# Patient Record
Sex: Female | Born: 1995 | Race: Asian | Hispanic: No | Marital: Married | Smoking: Never smoker
Health system: Southern US, Community
[De-identification: ages and names within clinical notes are randomized; demographics above are authoritative.]

## PROBLEM LIST (undated history)

## (undated) ENCOUNTER — Inpatient Hospital Stay (HOSPITAL_COMMUNITY): Payer: Self-pay

## (undated) DIAGNOSIS — D649 Anemia, unspecified: Secondary | ICD-10-CM

## (undated) HISTORY — PX: NO PAST SURGERIES: SHX2092

## (undated) HISTORY — DX: Anemia, unspecified: D64.9

---
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
## 2015-02-27 LAB — OB RESULTS CONSOLE GC/CHLAMYDIA
CHLAMYDIA, DNA PROBE: NEGATIVE
GC PROBE AMP, GENITAL: NEGATIVE

## 2015-02-27 LAB — OB RESULTS CONSOLE ABO/RH: RH Type: POSITIVE

## 2015-02-27 LAB — OB RESULTS CONSOLE PLATELET COUNT
Platelets: 198 10*3/uL
Platelets: 198 10*3/uL

## 2015-02-27 LAB — OB RESULTS CONSOLE VARICELLA ZOSTER ANTIBODY, IGG: VARICELLA IGG: IMMUNE

## 2015-02-27 LAB — OB RESULTS CONSOLE HGB/HCT, BLOOD
HCT: 38 %
HCT: 38 %
HEMOGLOBIN: 12.3 g/dL
HEMOGLOBIN: 12.3 g/dL

## 2015-02-27 LAB — SICKLE CELL SCREEN: Sickle Cell Screen: NORMAL

## 2015-02-27 LAB — OB RESULTS CONSOLE RPR: RPR: NONREACTIVE

## 2015-02-28 ENCOUNTER — Encounter: Payer: Self-pay | Admitting: *Deleted

## 2015-02-28 DIAGNOSIS — IMO0002 Reserved for concepts with insufficient information to code with codable children: Secondary | ICD-10-CM

## 2015-02-28 LAB — OB RESULTS CONSOLE HEPATITIS B SURFACE ANTIGEN: HEP B S AG: NEGATIVE

## 2015-02-28 LAB — OB RESULTS CONSOLE ANTIBODY SCREEN: Antibody Screen: NEGATIVE

## 2015-02-28 LAB — OB RESULTS CONSOLE HIV ANTIBODY (ROUTINE TESTING): HIV: NONREACTIVE

## 2015-02-28 LAB — OB RESULTS CONSOLE RUBELLA ANTIBODY, IGM: RUBELLA: IMMUNE

## 2015-03-16 ENCOUNTER — Encounter: Payer: Medicaid Other | Admitting: Obstetrics & Gynecology

## 2015-03-21 ENCOUNTER — Encounter: Payer: Self-pay | Admitting: *Deleted

## 2015-04-06 ENCOUNTER — Encounter: Payer: Self-pay | Admitting: Obstetrics & Gynecology

## 2015-04-06 ENCOUNTER — Ambulatory Visit (INDEPENDENT_AMBULATORY_CARE_PROVIDER_SITE_OTHER): Payer: Medicaid Other | Admitting: Obstetrics & Gynecology

## 2015-04-06 VITALS — BP 126/70 | HR 64 | Temp 97.6°F | Ht 62.5 in | Wt 98.1 lb

## 2015-04-06 DIAGNOSIS — O09892 Supervision of other high risk pregnancies, second trimester: Secondary | ICD-10-CM

## 2015-04-06 DIAGNOSIS — Z23 Encounter for immunization: Secondary | ICD-10-CM | POA: Diagnosis not present

## 2015-04-06 DIAGNOSIS — O09212 Supervision of pregnancy with history of pre-term labor, second trimester: Secondary | ICD-10-CM | POA: Diagnosis not present

## 2015-04-06 DIAGNOSIS — O09292 Supervision of pregnancy with other poor reproductive or obstetric history, second trimester: Secondary | ICD-10-CM

## 2015-04-06 DIAGNOSIS — N898 Other specified noninflammatory disorders of vagina: Secondary | ICD-10-CM

## 2015-04-06 DIAGNOSIS — O0992 Supervision of high risk pregnancy, unspecified, second trimester: Secondary | ICD-10-CM

## 2015-04-06 LAB — POCT URINALYSIS DIP (DEVICE)
Bilirubin Urine: NEGATIVE
GLUCOSE, UA: NEGATIVE mg/dL
Hgb urine dipstick: NEGATIVE
KETONES UR: NEGATIVE mg/dL
LEUKOCYTES UA: NEGATIVE
Nitrite: NEGATIVE
Protein, ur: NEGATIVE mg/dL
SPECIFIC GRAVITY, URINE: 1.015 (ref 1.005–1.030)
UROBILINOGEN UA: 0.2 mg/dL (ref 0.0–1.0)
pH: 7 (ref 5.0–8.0)

## 2015-04-06 NOTE — Progress Notes (Signed)
   Subjective:    Sylvia Bullock is a G2P0100 [redacted]w[redacted]d being seen today for her first obstetrical visit.  Her obstetrical history is significant for PPROM at 21 weeks.  Went home and then delivered 3 days later and baby lived a few minutes. Patient does intend to breast feed. Pregnancy history fully reviewed.  Patient reports no bleeding, no contractions, no cramping and no leaking.  Filed Vitals:   04/06/15 0856 04/06/15 0905  BP:  126/70  Pulse:  64  Temp:  97.6 F (36.4 C)  Height:  (1.575 m)   Weight:  98 lb 1.6 oz (44.498 kg)    HISTORY: OB History  Gravida Para Term Preterm AB SAB TAB Ectopic Multiple Living  # Outcome Date GA Lbr Len/2nd Weight Sex Delivery Anes PTL Lv  2 Current           1 Preterm 03/30/14 [redacted]w[redacted]d   F Vag-Spont None  ND    Obstetric Comments  Fetal Death on 04/07/14   History reviewed. No pertinent past medical history. History reviewed. No pertinent past surgical history. History reviewed. No pertinent family history.   Exam    Uterus:     Pelvic Exam:    Perineum: Normal Perineum   Vulva: normal   Vagina:  normal mucosa, curdlike discharge   pH: N/a   Cervix: no lesions and closed at internal os   Adnexa: normal adnexa   Bony Pelvis: platypelloid  System: Breast:  normal appearance, no masses or tenderness   Skin: normal coloration and turgor, no rashes    Neurologic: oriented, normal mood   Extremities: normal strength, tone, and muscle mass   HEENT sclera clear, anicteric and oropharynx clear, no lesions   Mouth/Teeth mucous membranes moist, pharynx normal without lesions   Neck supple and no masses   Cardiovascular: regular rate and rhythm   Respiratory:  appears well, vitals normal, no respiratory distress, acyanotic, normal RR, chest clear, no wheezing, crepitations, rhonchi, normal symmetric air entry   Abdomen: soft, non-tender; bowel sounds normal; no masses,  no organomegaly   Urinary: urethral meatus normal       Assessment:    Pregnancy: G2P0100 Patient Active Problem List   Diagnosis Date Noted  . Fetal death Mar 07, 2015        Plan:     Initial labs drawn. Prenatal vitamins. Problem list reviewed and updated. Genetic Screening discussed--Need to talk so me more and draw QUAD screen if she desires.  Ultrasound discussed; fetal survey: ordered.  Follow up in 2 weeks.  1>  Probable cervical incompetence--Needs 17P, STAT US for cervical length 2>Confirm genetic screening 3>Finish getting labs from health department 4>cervix closed today (internal os is close, external os is 1 cm) Leverne Amrhein H. 04/06/2015

## 2015-04-06 NOTE — Progress Notes (Signed)
Ultrasound scheduled for 04/11/2015 :00am [name] interpreter Win Khine)

## 2015-04-06 NOTE — Progress Notes (Signed)
Language Resources Interpreter Win Khine New ob packet given

## 2015-04-06 NOTE — Progress Notes (Signed)
Nutrition note: 1st visit consult Pt was underwt prior to pregnancy.  Pt has gained 13.1# @ [redacted]w[redacted]d, which is slightly > expected. Pt reports eating 2-3 meals & 1-2 snacks/d. Pt is not taking a PNV currently due to not liking the most recent ones she got. Pt reports no N&V or heartburn. NKFA. Pt received verbal & written education via an interpreter about general nutrition during pregnancy. Encouraged PNV daily. Discussed importance of BF & discussed size of baby's stomach. Discussed wt gain goals of 28-40# or 1#/wk. Pt agrees to restart taking a PNV. Pt has WIC & plans to BF. F/u as needed Blondell Reveal, MS, RD, LDN, [facility]

## 2015-04-07 ENCOUNTER — Encounter: Payer: Self-pay | Admitting: Obstetrics & Gynecology

## 2015-04-07 DIAGNOSIS — O0990 Supervision of high risk pregnancy, unspecified, unspecified trimester: Secondary | ICD-10-CM | POA: Insufficient documentation

## 2015-04-07 DIAGNOSIS — O0992 Supervision of high risk pregnancy, unspecified, second trimester: Secondary | ICD-10-CM | POA: Insufficient documentation

## 2015-04-07 DIAGNOSIS — Z8751 Personal history of pre-term labor: Secondary | ICD-10-CM | POA: Insufficient documentation

## 2015-04-07 LAB — WET PREP, GENITAL
TRICH WET PREP: NONE SEEN
YEAST WET PREP: NONE SEEN

## 2015-04-09 ENCOUNTER — Other Ambulatory Visit: Payer: Self-pay | Admitting: Obstetrics & Gynecology

## 2015-04-09 MED ORDER — METRONIDAZOLE 500 MG PO TABS: 500.0000 mg | ORAL_TABLET | Freq: Two times a day (BID) | ORAL | Status: DC

## 2015-04-10 ENCOUNTER — Telehealth: Payer: Self-pay | Admitting: *Deleted

## 2015-04-10 NOTE — Telephone Encounter (Signed)
[employer] Interpreter ID # [phone]  Attempted to contact patient to inform of need to come to clinic on 04/11/15 for Quad test/17P paperwork, no answer, left message for patient to come to the clinic.

## 2015-04-11 ENCOUNTER — Ambulatory Visit (HOSPITAL_COMMUNITY): Admission: RE | Admit: 2015-04-11 | Payer: Medicaid Other | Source: Ambulatory Visit

## 2015-04-13 ENCOUNTER — Encounter: Payer: Self-pay | Admitting: Obstetrics & Gynecology

## 2015-04-19 ENCOUNTER — Ambulatory Visit (HOSPITAL_COMMUNITY)
Admission: RE | Admit: 2015-04-19 | Discharge: 2015-04-19 | Disposition: A | Discharge status: Home / Self Care | Payer: Medicaid Other | Source: Ambulatory Visit | Attending: Obstetrics & Gynecology | Admitting: Obstetrics & Gynecology

## 2015-04-19 DIAGNOSIS — O09292 Supervision of pregnancy with other poor reproductive or obstetric history, second trimester: Secondary | ICD-10-CM | POA: Insufficient documentation

## 2015-04-20 ENCOUNTER — Ambulatory Visit (INDEPENDENT_AMBULATORY_CARE_PROVIDER_SITE_OTHER): Payer: Medicaid Other | Admitting: Obstetrics & Gynecology

## 2015-04-20 VITALS — BP 125/69 | HR 82 | Temp 98.5°F | Wt 98.0 lb

## 2015-04-20 DIAGNOSIS — O09892 Supervision of other high risk pregnancies, second trimester: Secondary | ICD-10-CM

## 2015-04-20 DIAGNOSIS — O09292 Supervision of pregnancy with other poor reproductive or obstetric history, second trimester: Secondary | ICD-10-CM | POA: Diagnosis not present

## 2015-04-20 DIAGNOSIS — O09212 Supervision of pregnancy with history of pre-term labor, second trimester: Secondary | ICD-10-CM

## 2015-04-20 DIAGNOSIS — O0992 Supervision of high risk pregnancy, unspecified, second trimester: Secondary | ICD-10-CM

## 2015-04-20 LAB — POCT URINALYSIS DIP (DEVICE)
BILIRUBIN URINE: NEGATIVE
GLUCOSE, UA: NEGATIVE mg/dL
HGB URINE DIPSTICK: NEGATIVE
Ketones, ur: NEGATIVE mg/dL
LEUKOCYTES UA: NEGATIVE
Nitrite: NEGATIVE
Protein, ur: NEGATIVE mg/dL
Specific Gravity, Urine: 1.01 (ref 1.005–1.030)
UROBILINOGEN UA: 0.2 mg/dL (ref 0.0–1.0)
pH: 7 (ref 5.0–8.0)

## 2015-04-20 MED ORDER — METRONIDAZOLE 500 MG PO TABS: 500.0000 mg | ORAL_TABLET | Freq: Two times a day (BID) | ORAL | Status: DC

## 2015-04-20 MED ORDER — SE-NATAL 19 29-1 MG PO TABS: 1.0000 | ORAL_TABLET | Freq: Every day | ORAL | Status: DC

## 2015-04-20 NOTE — Progress Notes (Signed)
Breastfeeding tip of the week reviewed [employer] interpreter [phone] used

## 2015-04-20 NOTE — Progress Notes (Signed)
Subjective:  Sylvia Bullock is a 19 y.o. G2P0100 at 6348w4d being seen today for ongoing prenatal care.  Patient reports no complaints.  Contractions: Not present.  Vag. Bleeding: None. Movement: Present. Denies leaking of fluid.   The following portions of the patient's history were reviewed and updated as appropriate: allergies, current medications, past family history, past medical history, past social history, past surgical history and problem list. Problem list updated.  Objective:   Filed Vitals:   04/20/15 1015  BP: 125/69  Pulse: 82  Temp: 98.5 F (36.9 C)  Weight: 98 lb (44.453 kg)    Fetal Status: Fetal Heart Rate (bpm): 147   Movement: Present     General:  Alert, oriented and cooperative. Patient is in no acute distress.  Skin: Skin is warm and dry. No rash noted.   Cardiovascular: Normal heart rate noted  Respiratory: Normal respiratory effort, no problems with respiration noted  Abdomen: Soft, gravid, appropriate for gestational age. Pain/Pressure: Absent     Pelvic: Vag. Bleeding: None     Cervical exam deferred        Extremities: Normal range of motion.  Edema: None  Mental Status: Normal mood and affect. Normal behavior. Normal judgment and thought content.   Urinalysis:      Assessment and Plan:  Pregnancy: G2P0100 at 1248w4d  1. Supervision of high-risk pregnancy, second trimester H/o previable delivery, nl cervix on US  2. History of preterm delivery, currently pregnant, second trimester cx length nl  3. History of preterm premature rupture of membranes (PROM) in previous pregnancy, currently pregnant, second trimester  - Prenatal Vit-DSS-Fe Fum-FA (SE-NATAL 19) 29-1 MG TABS; Take 1 tablet by mouth daily before breakfast.  Dispense: 30 tablet; Refill: 5  Preterm labor symptoms and general obstetric precautions including but not limited to vaginal bleeding, contractions, leaking of fluid and fetal movement were reviewed in detail with the patient. Please refer to  After Visit Summary for other counseling recommendations.  Return in about 2 weeks (around 05/04/2015). F/u US 2 weeks  Adam PhenixJames G Kayon Dozier, MD

## 2015-04-20 NOTE — Patient Instructions (Signed)

## 2015-04-24 ENCOUNTER — Encounter: Payer: Self-pay | Admitting: *Deleted

## 2015-05-03 ENCOUNTER — Encounter (HOSPITAL_COMMUNITY): Payer: Self-pay

## 2015-05-03 ENCOUNTER — Ambulatory Visit (HOSPITAL_COMMUNITY)
Admission: RE | Admit: 2015-05-03 | Discharge: 2015-05-03 | Disposition: A | Discharge status: Home / Self Care | Payer: Medicaid Other | Source: Ambulatory Visit | Attending: Family Medicine | Admitting: Family Medicine

## 2015-05-03 ENCOUNTER — Other Ambulatory Visit (HOSPITAL_COMMUNITY): Payer: Self-pay | Admitting: Maternal and Fetal Medicine

## 2015-05-03 DIAGNOSIS — Z3A21 21 weeks gestation of pregnancy: Secondary | ICD-10-CM

## 2015-05-03 DIAGNOSIS — O09299 Supervision of pregnancy with other poor reproductive or obstetric history, unspecified trimester: Secondary | ICD-10-CM | POA: Insufficient documentation

## 2015-05-03 DIAGNOSIS — O09292 Supervision of pregnancy with other poor reproductive or obstetric history, second trimester: Secondary | ICD-10-CM

## 2015-05-04 ENCOUNTER — Encounter: Payer: Self-pay | Admitting: Family Medicine

## 2015-05-04 ENCOUNTER — Encounter: Payer: Medicaid Other | Admitting: Family Medicine

## 2015-05-17 ENCOUNTER — Ambulatory Visit (HOSPITAL_COMMUNITY)
Admission: RE | Admit: 2015-05-17 | Discharge: 2015-05-17 | Disposition: A | Discharge status: Home / Self Care | Payer: Medicaid Other | Source: Ambulatory Visit | Attending: Family Medicine | Admitting: Family Medicine

## 2015-05-17 ENCOUNTER — Encounter (HOSPITAL_COMMUNITY): Payer: Self-pay

## 2015-05-17 DIAGNOSIS — Z3A23 23 weeks gestation of pregnancy: Secondary | ICD-10-CM | POA: Insufficient documentation

## 2015-05-17 DIAGNOSIS — Z36 Encounter for antenatal screening of mother: Secondary | ICD-10-CM | POA: Insufficient documentation

## 2015-05-17 DIAGNOSIS — O09299 Supervision of pregnancy with other poor reproductive or obstetric history, unspecified trimester: Secondary | ICD-10-CM | POA: Insufficient documentation

## 2015-05-17 DIAGNOSIS — O09292 Supervision of pregnancy with other poor reproductive or obstetric history, second trimester: Secondary | ICD-10-CM

## 2015-05-18 ENCOUNTER — Ambulatory Visit (INDEPENDENT_AMBULATORY_CARE_PROVIDER_SITE_OTHER): Payer: Medicaid Other | Admitting: Obstetrics & Gynecology

## 2015-05-18 VITALS — BP 129/71 | HR 68 | Temp 98.0°F | Wt 103.3 lb

## 2015-05-18 DIAGNOSIS — O09212 Supervision of pregnancy with history of pre-term labor, second trimester: Secondary | ICD-10-CM | POA: Diagnosis present

## 2015-05-18 DIAGNOSIS — O09892 Supervision of other high risk pregnancies, second trimester: Secondary | ICD-10-CM

## 2015-05-18 LAB — POCT URINALYSIS DIP (DEVICE)
BILIRUBIN URINE: NEGATIVE
GLUCOSE, UA: NEGATIVE mg/dL
Ketones, ur: NEGATIVE mg/dL
NITRITE: NEGATIVE
Protein, ur: NEGATIVE mg/dL
Specific Gravity, Urine: 1.02 (ref 1.005–1.030)
Urobilinogen, UA: 0.2 mg/dL (ref 0.0–1.0)
pH: 7 (ref 5.0–8.0)

## 2015-05-18 MED ORDER — METRONIDAZOLE 500 MG PO TABS: 500.0000 mg | ORAL_TABLET | Freq: Two times a day (BID) | ORAL | Status: DC

## 2015-05-18 MED ORDER — PROGESTERONE MICRONIZED 200 MG PO CAPS: 200.0000 mg | ORAL_CAPSULE | Freq: Every day | ORAL | Status: DC

## 2015-05-18 NOTE — Patient Instructions (Signed)

## 2015-05-18 NOTE — Progress Notes (Signed)
OB f/u US scheduled for December 2 @ 1515

## 2015-05-18 NOTE — Progress Notes (Signed)
Language Line Used: [phone]

## 2015-05-18 NOTE — Progress Notes (Signed)
Cervix short yesterday by US Subjective:  Sylvia Bullock is a 19 y.o. G2P0100 at 2530w4d being seen today for ongoing prenatal care.  Patient reports no complaints.  Contractions: Not present.  Vag. Bleeding: None. Movement: Present. Denies leaking of fluid.   The following portions of the patient's history were reviewed and updated as appropriate: allergies, current medications, past family history, past medical history, past social history, past surgical history and problem list. Problem list updated.  Objective:   Filed Vitals:   05/18/15 1149  BP: 129/71  Pulse: 68  Temp: 98 F (36.7 C)  Weight: 103 lb 4.8 oz (46.857 kg)    Fetal Status:     Movement: Present     General:  Alert, oriented and cooperative. Patient is in no acute distress.  Skin: Skin is warm and dry. No rash noted.   Cardiovascular: Normal heart rate noted  Respiratory: Normal respiratory effort, no problems with respiration noted  Abdomen: Soft, gravid, appropriate for gestational age. Pain/Pressure: Absent     Pelvic: Vag. Bleeding: None     Cervical exam deferred        Extremities: Normal range of motion.  Edema: None  Mental Status: Normal mood and affect. Normal behavior. Normal judgment and thought content.   Urinalysis: Urine Protein: Negative Urine Glucose: Negative  Assessment and Plan:  Pregnancy: G2P0100 at 930w4d  1. History of preterm delivery, currently pregnant, second trimester Short cervix 2.6 cm 11/16 US - POCT urinalysis dip (device) - metroNIDAZOLE (FLAGYL) 500 MG tablet; Take 1 tablet (500 mg total) by mouth 2 (two) times daily with a meal.  Dispense: 14 tablet; Refill: 0 - US OB Transvaginal; Future - progesterone (PROMETRIUM) 200 MG capsule; Place 1 capsule (200 mg total) vaginally daily.  Dispense: 30 capsule; Refill: 6  Preterm labor symptoms and general obstetric precautions including but not limited to vaginal bleeding, contractions, leaking of fluid and fetal movement were reviewed in  detail with the patient. Please refer to After Visit Summary for other counseling recommendations.  Return in about 3 weeks (around 06/08/2015).   Adam PhenixJames G Sharice Harriss, MD

## 2015-06-02 ENCOUNTER — Ambulatory Visit (HOSPITAL_COMMUNITY)
Admission: RE | Admit: 2015-06-02 | Discharge: 2015-06-02 | Disposition: A | Discharge status: Home / Self Care | Payer: Medicaid Other | Source: Ambulatory Visit | Attending: Obstetrics & Gynecology | Admitting: Obstetrics & Gynecology

## 2015-06-02 ENCOUNTER — Other Ambulatory Visit: Payer: Self-pay | Admitting: Obstetrics & Gynecology

## 2015-06-02 ENCOUNTER — Encounter (HOSPITAL_COMMUNITY): Payer: Self-pay

## 2015-06-02 DIAGNOSIS — O09892 Supervision of other high risk pregnancies, second trimester: Secondary | ICD-10-CM

## 2015-06-02 DIAGNOSIS — Z3A25 25 weeks gestation of pregnancy: Secondary | ICD-10-CM | POA: Diagnosis not present

## 2015-06-02 DIAGNOSIS — O09212 Supervision of pregnancy with history of pre-term labor, second trimester: Principal | ICD-10-CM

## 2015-06-02 DIAGNOSIS — O09292 Supervision of pregnancy with other poor reproductive or obstetric history, second trimester: Secondary | ICD-10-CM | POA: Diagnosis not present

## 2015-06-02 DIAGNOSIS — Z3A26 26 weeks gestation of pregnancy: Secondary | ICD-10-CM

## 2015-06-08 ENCOUNTER — Ambulatory Visit (INDEPENDENT_AMBULATORY_CARE_PROVIDER_SITE_OTHER): Payer: Medicaid Other | Admitting: Family

## 2015-06-08 VITALS — BP 117/69 | HR 72 | Temp 98.3°F | Wt 103.4 lb

## 2015-06-08 DIAGNOSIS — O09212 Supervision of pregnancy with history of pre-term labor, second trimester: Secondary | ICD-10-CM | POA: Diagnosis not present

## 2015-06-08 DIAGNOSIS — O0992 Supervision of high risk pregnancy, unspecified, second trimester: Secondary | ICD-10-CM

## 2015-06-08 DIAGNOSIS — O09892 Supervision of other high risk pregnancies, second trimester: Secondary | ICD-10-CM

## 2015-06-08 LAB — POCT URINALYSIS DIP (DEVICE)
Bilirubin Urine: NEGATIVE
Glucose, UA: NEGATIVE mg/dL
HGB URINE DIPSTICK: NEGATIVE
Ketones, ur: NEGATIVE mg/dL
Leukocytes, UA: NEGATIVE
NITRITE: NEGATIVE
PH: 7 (ref 5.0–8.0)
PROTEIN: NEGATIVE mg/dL
Specific Gravity, Urine: 1.01 (ref 1.005–1.030)
Urobilinogen, UA: 0.2 mg/dL (ref 0.0–1.0)

## 2015-06-08 NOTE — Progress Notes (Signed)
Subjective:  Sylvia Bullock is a 19 y.o. G2P0100 at 7167w3d being seen today for ongoing prenatal care.  She is currently monitored for the following issues for this high-risk pregnancy and has hx of fetal death; History of preterm delivery, currently pregnant; and Supervision of high-risk pregnancy on her problem list.  Patient reports no complaints.  Contractions: Not present. Vag. Bleeding: None.  Movement: Present. Denies leaking of fluid.   The following portions of the patient's history were reviewed and updated as appropriate: allergies, current medications, past family history, past medical history, past social history, past surgical history and problem list. Problem list updated.  Objective:   Filed Vitals:   06/08/15 1007  BP: 117/69  Pulse: 72  Temp: 98.3 F (36.8 C)  Weight: 103 lb 6.4 oz (46.902 kg)    Fetal Status: Fetal Heart Rate (bpm): 157 Fundal Height: 26 cm Movement: Present     General:  Alert, oriented and cooperative. Patient is in no acute distress.  Skin: Skin is warm and dry. No rash noted.   Cardiovascular: Normal heart rate noted  Respiratory: Normal respiratory effort, no problems with respiration noted  Abdomen: Soft, gravid, appropriate for gestational age. Pain/Pressure: Present     Pelvic: Vag. Bleeding: None     Cervical exam deferred        Extremities: Normal range of motion.  Edema: None  Mental Status: Normal mood and affect. Normal behavior. Normal judgment and thought content.   Urinalysis: Urine Protein: Negative Urine Glucose: Negative  Assessment and Plan:  Pregnancy: G2P0100 at 5267w3d  1. Supervision of high-risk pregnancy, second trimester - Obtain lab results from Health Dept; ultrasound scheduled on 06/20/15  2. History of preterm delivery, currently pregnant, second trimester - Cervical length 06/20/15 - Prometrium  Preterm labor symptoms and general obstetric precautions including but not limited to vaginal bleeding, contractions,  leaking of fluid and fetal movement were reviewed in detail with the patient. Please refer to After Visit Summary for other counseling recommendations.  Return in about 2 weeks (around 06/22/2015).   Eino FarberWalidah Kennith GainN Karim, CNM

## 2015-06-09 ENCOUNTER — Encounter: Payer: Self-pay | Admitting: *Deleted

## 2015-06-09 DIAGNOSIS — O0990 Supervision of high risk pregnancy, unspecified, unspecified trimester: Secondary | ICD-10-CM

## 2015-06-09 DIAGNOSIS — O09899 Supervision of other high risk pregnancies, unspecified trimester: Secondary | ICD-10-CM

## 2015-06-09 DIAGNOSIS — O09219 Supervision of pregnancy with history of pre-term labor, unspecified trimester: Principal | ICD-10-CM

## 2015-06-20 ENCOUNTER — Ambulatory Visit (HOSPITAL_COMMUNITY)
Admission: RE | Admit: 2015-06-20 | Discharge: 2015-06-20 | Disposition: A | Discharge status: Home / Self Care | Payer: Medicaid Other | Source: Ambulatory Visit | Attending: Obstetrics & Gynecology | Admitting: Obstetrics & Gynecology

## 2015-06-20 ENCOUNTER — Other Ambulatory Visit (HOSPITAL_COMMUNITY): Payer: Self-pay | Admitting: Obstetrics and Gynecology

## 2015-06-20 ENCOUNTER — Encounter (HOSPITAL_COMMUNITY): Payer: Self-pay

## 2015-06-20 DIAGNOSIS — Z3A28 28 weeks gestation of pregnancy: Secondary | ICD-10-CM | POA: Diagnosis not present

## 2015-06-20 DIAGNOSIS — O09212 Supervision of pregnancy with history of pre-term labor, second trimester: Principal | ICD-10-CM

## 2015-06-20 DIAGNOSIS — O09292 Supervision of pregnancy with other poor reproductive or obstetric history, second trimester: Secondary | ICD-10-CM | POA: Insufficient documentation

## 2015-06-20 DIAGNOSIS — O09892 Supervision of other high risk pregnancies, second trimester: Secondary | ICD-10-CM

## 2015-06-22 ENCOUNTER — Ambulatory Visit (INDEPENDENT_AMBULATORY_CARE_PROVIDER_SITE_OTHER): Payer: Medicaid Other | Admitting: Family

## 2015-06-22 VITALS — BP 136/69 | HR 73 | Temp 98.0°F | Wt 108.3 lb

## 2015-06-22 DIAGNOSIS — O0990 Supervision of high risk pregnancy, unspecified, unspecified trimester: Secondary | ICD-10-CM

## 2015-06-22 DIAGNOSIS — O09892 Supervision of other high risk pregnancies, second trimester: Secondary | ICD-10-CM

## 2015-06-22 DIAGNOSIS — O09212 Supervision of pregnancy with history of pre-term labor, second trimester: Secondary | ICD-10-CM | POA: Diagnosis not present

## 2015-06-22 LAB — CBC
HCT: 35.7 % — ABNORMAL LOW (ref 36.0–46.0)
Hemoglobin: 12 g/dL (ref 12.0–15.0)
MCH: 29.4 pg (ref 26.0–34.0)
MCHC: 33.6 g/dL (ref 30.0–36.0)
MCV: 87.5 fL (ref 78.0–100.0)
MPV: 10.3 fL (ref 8.6–12.4)
PLATELETS: 288 10*3/uL (ref 150–400)
RBC: 4.08 MIL/uL (ref 3.87–5.11)
RDW: 13.8 % (ref 11.5–15.5)
WBC: 8 10*3/uL (ref 4.0–10.5)

## 2015-06-22 LAB — POCT URINALYSIS DIP (DEVICE)
BILIRUBIN URINE: NEGATIVE
GLUCOSE, UA: NEGATIVE mg/dL
Hgb urine dipstick: NEGATIVE
Ketones, ur: NEGATIVE mg/dL
Leukocytes, UA: NEGATIVE
NITRITE: NEGATIVE
PH: 7 (ref 5.0–8.0)
Protein, ur: NEGATIVE mg/dL
Specific Gravity, Urine: 1.015 (ref 1.005–1.030)
Urobilinogen, UA: 1 mg/dL (ref 0.0–1.0)

## 2015-06-22 NOTE — Progress Notes (Signed)
Subjective:  Sylvia Bullock is a 19 y.o. G2P0100 at 7671w3d being seen today for ongoing prenatal care.  She is currently monitored for the following issues for this high-risk pregnancy and has history of fetal death; history of preterm delivery, currently pregnant; and Supervision of high-risk pregnancy on her problem list.  Patient reports no complaints.  Contractions: Not present. Vag. Bleeding: None.  Movement: Present. Denies leaking of fluid.   The following portions of the patient's history were reviewed and updated as appropriate: allergies, current medications, past family history, past medical history, past social history, past surgical history and problem list. Problem list updated.  Objective:   Filed Vitals:   06/22/15 0949  BP: 136/69  Pulse: 73  Temp: 98 F (36.7 C)  Weight: 49.125 kg (108 lb 4.8 oz)    Fetal Status: Fetal Heart Rate (bpm): 154 Fundal Height: 27 cm Movement: Present     General:  Alert, oriented and cooperative. Patient is in no acute distress.  Skin: Skin is warm and dry. No rash noted.   Cardiovascular: Normal heart rate noted  Respiratory: Normal respiratory effort, no problems with respiration noted  Abdomen: Soft, gravid, appropriate for gestational age. Pain/Pressure: Absent     Pelvic: Vag. Bleeding: None     Cervical exam deferred        Extremities: Normal range of motion.  Edema: None  Mental Status: Normal mood and affect. Normal behavior. Normal judgment and thought content.   Urinalysis: Urine Protein: Negative Urine Glucose: Negative  Assessment and Plan:  Pregnancy: G2P0100 at 4171w3d  1. History of preterm delivery, currently pregnant, second trimester - Reviewed last ultrasound cervical length - 2.7 cm  2. Supervision of high-risk pregnancy, unspecified trimester - Glucose Tolerance, 1 HR (50g) w/o Fasting - CBC - RPR - HIV antibody (with reflex)  Preterm labor symptoms and general obstetric precautions including but not limited to  vaginal bleeding, contractions, leaking of fluid and fetal movement were reviewed in detail with the patient. Please refer to After Visit Summary for other counseling recommendations.  Return in about 2 weeks (around 07/06/2015).   Eino FarberWalidah Kennith GainN Karim, CNM

## 2015-06-23 LAB — GLUCOSE TOLERANCE, 1 HOUR (50G) W/O FASTING: GLUCOSE 1 HOUR GTT: 64 mg/dL — AB (ref 70–140)

## 2015-06-23 LAB — HIV ANTIBODY (ROUTINE TESTING W REFLEX): HIV: NONREACTIVE

## 2015-06-26 LAB — RPR

## 2015-07-02 NOTE — L&D Delivery Note (Signed)
Patient is 20 y.o. G2P0100 [redacted]w[redacted]d admitted for PPROM. Receivec BMZ x 1. Labor progressed rapidly and patient was complete.    Delivery Note At 8:43 PM a viable female was delivered via Vaginal, Spontaneous Delivery (Presentation: Left Occiput Anterior).  APGAR: 9, 10; weight 2430g  .   Placenta status: Intact, Spontaneous. Manual extraction of trailing membranes x 1 then again due to continued vaginal bleeding. Patient was given Ancef 2g and Methergine.  Cord: 3 vessels with the following complications: retained placental membrane removed manually. Ancef 2 g given.  Cord pH: not obtained.   Anesthesia: None  Episiotomy: None Lacerations: 1st degree;Perineal-repaired; left labial laceration- repaired; right periurethral laceration- did not require repair Suture Repair: 3.0 and 4.0 vicryl Est. Blood Loss (mL):    Mom to postpartum.  Baby to Couplet care / Skin to Skin.  Palma Holter 08/15/2015, 9:24 PM    Upon arrival patient was complete and pushing. She pushed with good maternal effort to deliver a healthy baby boy. Baby delivered without difficulty, was noted to have good tone and place on maternal abdomen for oral suctioning, drying and stimulation. Delayed cord clamping performed. Placenta delivered intact with 3V cord. Vaginal canal and perineum was inspected and 1st degree perineal laceration (repaired), left periurethral laceration (repaired), and right periurethral laceration (not requiring repair); hemostatic. Pitocin was started and uterus massaged until bleeding slowed. Counts of sharps, instruments, and lap pads were all correct.   Palma Holter, MD PGY 1 Family Medicine  OB FELLOW DELIVERY ATTESTATION  I was gloved and present for the delivery in its entirety, and I agree with the above resident's note.    Silvano Bilis, MD 11:43 PM

## 2015-07-06 ENCOUNTER — Ambulatory Visit (INDEPENDENT_AMBULATORY_CARE_PROVIDER_SITE_OTHER): Payer: Medicaid Other | Admitting: Family

## 2015-07-06 VITALS — BP 114/66 | HR 86 | Temp 98.0°F | Wt 109.4 lb

## 2015-07-06 DIAGNOSIS — O09893 Supervision of other high risk pregnancies, third trimester: Secondary | ICD-10-CM

## 2015-07-06 DIAGNOSIS — O0993 Supervision of high risk pregnancy, unspecified, third trimester: Secondary | ICD-10-CM

## 2015-07-06 DIAGNOSIS — O09213 Supervision of pregnancy with history of pre-term labor, third trimester: Secondary | ICD-10-CM | POA: Diagnosis not present

## 2015-07-06 LAB — POCT URINALYSIS DIP (DEVICE)
Bilirubin Urine: NEGATIVE
GLUCOSE, UA: NEGATIVE mg/dL
Hgb urine dipstick: NEGATIVE
Ketones, ur: NEGATIVE mg/dL
Nitrite: NEGATIVE
Protein, ur: NEGATIVE mg/dL
SPECIFIC GRAVITY, URINE: 1.02 (ref 1.005–1.030)
UROBILINOGEN UA: 1 mg/dL (ref 0.0–1.0)
pH: 7 (ref 5.0–8.0)

## 2015-07-06 MED ORDER — CYCLOBENZAPRINE HCL 10 MG PO TABS: 10.0000 mg | ORAL_TABLET | Freq: Every day | ORAL | Status: DC

## 2015-07-06 NOTE — Progress Notes (Signed)
Subjective:  Sylvia Bullock is a 20 y.o. G2P0100 at 7668w3d being seen today for ongoing prenatal care.  She is currently monitored for the following issues for this high-risk pregnancy and has hx of fetal death; history of preterm delivery, currently pregnant; and supervision of high-risk pregnancy on her problem list.  Patient reports lower back pain with walking and certain positions.  Contractions: Not present. Vag. Bleeding: None.  Movement: Present. Denies leaking of fluid.   The following portions of the patient's history were reviewed and updated as appropriate: allergies, current medications, past family history, past medical history, past social history, past surgical history and problem list. Problem list updated.  Objective:   Filed Vitals:   07/06/15 0926  BP: 114/66  Pulse: 86  Temp: 98 F (36.7 C)  Weight: 109 lb 6.4 oz (49.624 kg)    Fetal Status: Fetal Heart Rate (bpm): 148 Fundal Height: 31 cm Movement: Present     General:  Alert, oriented and cooperative. Patient is in no acute distress.  Skin: Skin is warm and dry. No rash noted.   Cardiovascular: Normal heart rate noted  Respiratory: Normal respiratory effort, no problems with respiration noted  Abdomen: Soft, gravid, appropriate for gestational age. Pain/Pressure: Present     Pelvic: Vag. Bleeding: None     Cervical exam deferred        Extremities: Normal range of motion.  Edema: None  Mental Status: Normal mood and affect. Normal behavior. Normal judgment and thought content.   Urinalysis:     Urine results not available at discharge.     Assessment and Plan:  Pregnancy: G2P0100 at 2468w3d  1. History of preterm delivery, currently pregnant, third trimester - Explained importance of prometrium and benefits > pt states will use consistently  2. Supervision of high-risk pregnancy, third trimester   Preterm labor symptoms and general obstetric precautions including but not limited to vaginal bleeding,  contractions, leaking of fluid and fetal movement were reviewed in detail with the patient. Please refer to After Visit Summary for other counseling recommendations.  Return in about 2 weeks (around 07/20/2015).   Eino FarberWalidah Kennith GainN Karim, CNM

## 2015-07-06 NOTE — Progress Notes (Signed)
Pt not using prometrium as prescribed. Just does it sometimes because she doesn't like to insert it.

## 2015-07-20 ENCOUNTER — Ambulatory Visit (INDEPENDENT_AMBULATORY_CARE_PROVIDER_SITE_OTHER): Payer: Medicaid Other | Admitting: Obstetrics & Gynecology

## 2015-07-20 VITALS — BP 115/70 | HR 66 | Temp 97.9°F | Wt 111.0 lb

## 2015-07-20 DIAGNOSIS — Z789 Other specified health status: Secondary | ICD-10-CM | POA: Insufficient documentation

## 2015-07-20 DIAGNOSIS — O09213 Supervision of pregnancy with history of pre-term labor, third trimester: Secondary | ICD-10-CM | POA: Diagnosis present

## 2015-07-20 DIAGNOSIS — Z603 Acculturation difficulty: Secondary | ICD-10-CM | POA: Insufficient documentation

## 2015-07-20 DIAGNOSIS — O0993 Supervision of high risk pregnancy, unspecified, third trimester: Secondary | ICD-10-CM

## 2015-07-20 DIAGNOSIS — O09893 Supervision of other high risk pregnancies, third trimester: Secondary | ICD-10-CM

## 2015-07-20 LAB — POCT URINALYSIS DIP (DEVICE)
BILIRUBIN URINE: NEGATIVE
Glucose, UA: NEGATIVE mg/dL
Hgb urine dipstick: NEGATIVE
KETONES UR: NEGATIVE mg/dL
Nitrite: NEGATIVE
PH: 7.5 (ref 5.0–8.0)
Protein, ur: NEGATIVE mg/dL
SPECIFIC GRAVITY, URINE: 1.02 (ref 1.005–1.030)
Urobilinogen, UA: 1 mg/dL (ref 0.0–1.0)

## 2015-07-20 NOTE — Progress Notes (Signed)
Used interpreter [employer] C/o feeling bad after meals- acid reflux. C/o problems going to bathroom- difficult due to language differences if she means constipation or gas.

## 2015-07-20 NOTE — Patient Instructions (Addendum)
Tums or Pepcid for acid reflux.    Return to clinic for any obstetric concerns or go to [facility] for evaluation

## 2015-07-20 NOTE — Progress Notes (Signed)
Subjective:  Sylvia Bullock is a 20 y.o. G2P0100 at [redacted]w[redacted]d being seen today for ongoing prenatal care.  She is currently monitored for the following issues for this high-risk pregnancy and has Fetal death; History of preterm delivery, currently pregnant; Supervision of high-risk pregnancy; and Language barrier, speaks [name] only on her problem list.  [name] interpreter present.  Patient reports acid reflux and constipation.  Contractions: Not present. Vag. Bleeding: None.  Movement: Present. Denies leaking of fluid.   The following portions of the patient's history were reviewed and updated as appropriate: allergies, current medications, past family history, past medical history, past social history, past surgical history and problem list. Problem list updated.  Objective:   Filed Vitals:   07/20/15 0956  BP: 115/70  Pulse: 66  Temp: 97.9 F (36.6 C)  Weight: 111 lb (50.349 kg)    Fetal Status: Fetal Heart Rate (bpm): 140 Fundal Height: 32 cm Movement: Present     General:  Alert, oriented and cooperative. Patient is in no acute distress.  Skin: Skin is warm and dry. No rash noted.   Cardiovascular: Normal heart rate noted  Respiratory: Normal respiratory effort, no problems with respiration noted  Abdomen: Soft, gravid, appropriate for gestational age. Pain/Pressure: Present     Pelvic: Vag. Bleeding: None     Cervical exam deferred        Extremities: Normal range of motion.  Edema: None (Simultaneous filing. User may not have seen previous data.)  Mental Status: Normal mood and affect. Normal behavior. Normal judgment and thought content.   Urinalysis: Urine Protein: Negative Urine Glucose: Negative  Assessment and Plan:  Pregnancy: G2P0100 at [redacted]w[redacted]d  1. History of preterm delivery, currently pregnant, third trimester Continue Prometrium vaginal capsules daily.  2. Supervision of high-risk pregnancy, third trimester Recommended OTC remedies for acid reflux and bowel issues Preterm  labor symptoms and general obstetric precautions including but not limited to vaginal bleeding, contractions, leaking of fluid and fetal movement were reviewed in detail with the patient. Please refer to After Visit Summary for other counseling recommendations.  Return in about 2 weeks (around 08/03/2015) for OB Visit.   Tereso Newcomer, MD

## 2015-08-03 ENCOUNTER — Telehealth: Payer: Self-pay | Admitting: Family

## 2015-08-03 ENCOUNTER — Encounter: Payer: Self-pay | Admitting: Family

## 2015-08-03 ENCOUNTER — Encounter: Payer: Medicaid Other | Admitting: Family

## 2015-08-03 NOTE — Telephone Encounter (Signed)
Called patient with Interpreter to inform her of missed appointment. Patient was not available, voicemail not left. Sending certified [facility] letter.

## 2015-08-07 ENCOUNTER — Encounter: Payer: Medicaid Other | Admitting: Family Medicine

## 2015-08-10 ENCOUNTER — Ambulatory Visit (INDEPENDENT_AMBULATORY_CARE_PROVIDER_SITE_OTHER): Payer: Medicaid Other | Admitting: Obstetrics & Gynecology

## 2015-08-10 VITALS — BP 125/82 | HR 60 | Temp 97.7°F | Wt 113.0 lb

## 2015-08-10 DIAGNOSIS — Z23 Encounter for immunization: Secondary | ICD-10-CM

## 2015-08-10 DIAGNOSIS — O0993 Supervision of high risk pregnancy, unspecified, third trimester: Secondary | ICD-10-CM

## 2015-08-10 LAB — POCT URINALYSIS DIP (DEVICE)
Bilirubin Urine: NEGATIVE
Glucose, UA: NEGATIVE mg/dL
HGB URINE DIPSTICK: NEGATIVE
Ketones, ur: NEGATIVE mg/dL
NITRITE: NEGATIVE
PH: 7 (ref 5.0–8.0)
PROTEIN: NEGATIVE mg/dL
Specific Gravity, Urine: 1.015 (ref 1.005–1.030)
Urobilinogen, UA: 0.2 mg/dL (ref 0.0–1.0)

## 2015-08-10 MED ORDER — TETANUS-DIPHTH-ACELL PERTUSSIS 5-2.5-18.5 LF-MCG/0.5 IM SUSP
0.5000 mL | Freq: Once | INTRAMUSCULAR | Status: AC
  Administered 2015-08-10: 0.5 mL via INTRAMUSCULAR

## 2015-08-10 NOTE — Progress Notes (Signed)
Win Khine used for interpreter  Discussed breastfeeding ed with patient

## 2015-08-10 NOTE — Progress Notes (Signed)
U/S scheduled for 08/11/2015 @ 9:00AM with help of [name] interpreter

## 2015-08-10 NOTE — Patient Instructions (Signed)

## 2015-08-10 NOTE — Progress Notes (Signed)
Subjective:  Sylvia Bullock is a 20 y.o. G2P0100 at [redacted]w[redacted]d being seen today for ongoing prenatal care.  She is currently monitored for the following issues for this high-risk pregnancy and has Fetal death; History of preterm delivery, currently pregnant; Supervision of high-risk pregnancy; and Language barrier, speaks [name] only on her problem list.  Patient reports no complaints.  Contractions: Not present. Vag. Bleeding: None.  Movement: Present. Denies leaking of fluid.   The following portions of the patient's history were reviewed and updated as appropriate: allergies, current medications, past family history, past medical history, past social history, past surgical history and problem list. Problem list updated.  Objective:   Filed Vitals:   08/10/15 1011  BP: 125/82  Pulse: 60  Temp: 97.7 F (36.5 C)  Weight: 113 lb (51.256 kg)    Fetal Status: Fetal Heart Rate (bpm): 137   Movement: Present     General:  Alert, oriented and cooperative. Patient is in no acute distress.  Skin: Skin is warm and dry. No rash noted.   Cardiovascular: Normal heart rate noted  Respiratory: Normal respiratory effort, no problems with respiration noted  Abdomen: Soft, gravid, appropriate for gestational age. Pain/Pressure: Absent     Pelvic: Vag. Bleeding: None     Cervical exam deferred        Extremities: Normal range of motion.  Edema: None  Mental Status: Normal mood and affect. Normal behavior. Normal judgment and thought content.   Urinalysis:      Assessment and Plan:  Pregnancy: G2P0100 at [redacted]w[redacted]d  1. Supervision of high-risk pregnancy, third trimester S<D - US OB Follow Up; Future Needs f/u US for growth Term labor symptoms and general obstetric precautions including but not limited to vaginal bleeding, contractions, leaking of fluid and fetal movement were reviewed in detail with the patient. Please refer to After Visit Summary for other counseling recommendations.  Return in about 1 week  (around 08/17/2015).   Adam Phenix, MD

## 2015-08-11 ENCOUNTER — Other Ambulatory Visit: Payer: Self-pay | Admitting: Obstetrics & Gynecology

## 2015-08-11 ENCOUNTER — Ambulatory Visit (HOSPITAL_COMMUNITY)
Admission: RE | Admit: 2015-08-11 | Discharge: 2015-08-11 | Disposition: A | Discharge status: Home / Self Care | Payer: Medicaid Other | Source: Ambulatory Visit | Attending: Obstetrics & Gynecology | Admitting: Obstetrics & Gynecology

## 2015-08-11 ENCOUNTER — Encounter (HOSPITAL_COMMUNITY): Payer: Self-pay

## 2015-08-11 DIAGNOSIS — O0993 Supervision of high risk pregnancy, unspecified, third trimester: Secondary | ICD-10-CM

## 2015-08-11 DIAGNOSIS — O09293 Supervision of pregnancy with other poor reproductive or obstetric history, third trimester: Secondary | ICD-10-CM

## 2015-08-11 DIAGNOSIS — Z3A35 35 weeks gestation of pregnancy: Secondary | ICD-10-CM | POA: Insufficient documentation

## 2015-08-11 DIAGNOSIS — O26843 Uterine size-date discrepancy, third trimester: Secondary | ICD-10-CM

## 2015-08-15 ENCOUNTER — Encounter (HOSPITAL_COMMUNITY): Payer: Self-pay | Admitting: Anesthesiology

## 2015-08-15 ENCOUNTER — Encounter (HOSPITAL_COMMUNITY): Payer: Self-pay | Admitting: *Deleted

## 2015-08-15 ENCOUNTER — Inpatient Hospital Stay (HOSPITAL_COMMUNITY)
Admission: AD | Admit: 2015-08-15 | Discharge: 2015-08-17 | DRG: 775 | Disposition: A | Discharge status: Home / Self Care | Payer: Medicaid Other | Source: Ambulatory Visit | Attending: Obstetrics & Gynecology | Admitting: Obstetrics & Gynecology

## 2015-08-15 DIAGNOSIS — Z3A36 36 weeks gestation of pregnancy: Secondary | ICD-10-CM

## 2015-08-15 DIAGNOSIS — O4290 Premature rupture of membranes, unspecified as to length of time between rupture and onset of labor, unspecified weeks of gestation: Secondary | ICD-10-CM | POA: Diagnosis present

## 2015-08-15 DIAGNOSIS — O4292 Full-term premature rupture of membranes, unspecified as to length of time between rupture and onset of labor: Principal | ICD-10-CM | POA: Diagnosis present

## 2015-08-15 DIAGNOSIS — O42913 Preterm premature rupture of membranes, unspecified as to length of time between rupture and onset of labor, third trimester: Secondary | ICD-10-CM | POA: Diagnosis not present

## 2015-08-15 DIAGNOSIS — O0993 Supervision of high risk pregnancy, unspecified, third trimester: Secondary | ICD-10-CM

## 2015-08-15 DIAGNOSIS — O09893 Supervision of other high risk pregnancies, third trimester: Secondary | ICD-10-CM

## 2015-08-15 DIAGNOSIS — O09213 Supervision of pregnancy with history of pre-term labor, third trimester: Secondary | ICD-10-CM

## 2015-08-15 LAB — CBC
HCT: 35.6 % — ABNORMAL LOW (ref 36.0–46.0)
Hemoglobin: 12.2 g/dL (ref 12.0–15.0)
MCH: 29.5 pg (ref 26.0–34.0)
MCHC: 34.3 g/dL (ref 30.0–36.0)
MCV: 86 fL (ref 78.0–100.0)
Platelets: 249 10*3/uL (ref 150–400)
RBC: 4.14 MIL/uL (ref 3.87–5.11)
RDW: 13.4 % (ref 11.5–15.5)
WBC: 7.7 10*3/uL (ref 4.0–10.5)

## 2015-08-15 LAB — TYPE AND SCREEN
ABO/RH(D): B POS
Antibody Screen: NEGATIVE

## 2015-08-15 LAB — POCT FERN TEST: POCT Fern Test: POSITIVE

## 2015-08-15 LAB — ABO/RH: ABO/RH(D): B POS

## 2015-08-15 MED ORDER — LACTATED RINGERS IV SOLN: 500.0000 mL | Freq: Once | INTRAVENOUS | Status: DC

## 2015-08-15 MED ORDER — ACETAMINOPHEN 325 MG PO TABS: 650.0000 mg | ORAL_TABLET | ORAL | Status: DC | PRN

## 2015-08-15 MED ORDER — BETAMETHASONE SOD PHOS & ACET 6 (3-3) MG/ML IJ SUSP
12.0000 mg | Freq: Once | INTRAMUSCULAR | Status: AC
  Administered 2015-08-15: 12 mg via INTRAMUSCULAR
  Filled 2015-08-15: qty 2

## 2015-08-15 MED ORDER — PRENATAL MULTIVITAMIN CH: 1.0000 | ORAL_TABLET | Freq: Every day | ORAL | Status: DC

## 2015-08-15 MED ORDER — ZOLPIDEM TARTRATE 5 MG PO TABS
5.0000 mg | ORAL_TABLET | Freq: Every evening | ORAL | Status: DC | PRN
Start: 2015-08-15 — End: 2015-08-17

## 2015-08-15 MED ORDER — IBUPROFEN 600 MG PO TABS
600.0000 mg | ORAL_TABLET | Freq: Four times a day (QID) | ORAL | Status: DC
  Filled 2015-08-15: qty 1

## 2015-08-15 MED ORDER — OXYTOCIN BOLUS FROM INFUSION
500.0000 mL | INTRAVENOUS | Status: DC
  Administered 2015-08-15: 500 mL via INTRAVENOUS

## 2015-08-15 MED ORDER — OXYCODONE-ACETAMINOPHEN 5-325 MG PO TABS: 2.0000 | ORAL_TABLET | ORAL | Status: DC | PRN

## 2015-08-15 MED ORDER — ONDANSETRON HCL 4 MG PO TABS: 4.0000 mg | ORAL_TABLET | ORAL | Status: DC | PRN

## 2015-08-15 MED ORDER — EPHEDRINE 5 MG/ML INJ
10.0000 mg | INTRAVENOUS | Status: DC | PRN
  Filled 2015-08-15: qty 2

## 2015-08-15 MED ORDER — ONDANSETRON HCL 4 MG/2ML IJ SOLN: 4.0000 mg | Freq: Four times a day (QID) | INTRAMUSCULAR | Status: DC | PRN

## 2015-08-15 MED ORDER — LACTATED RINGERS IV SOLN: 500.0000 mL | INTRAVENOUS | Status: DC | PRN

## 2015-08-15 MED ORDER — PENICILLIN G POTASSIUM 5000000 UNITS IJ SOLR: 2.5000 10*6.[IU] | INTRAVENOUS | Status: DC

## 2015-08-15 MED ORDER — DIBUCAINE 1 % RE OINT: 1.0000 "application " | TOPICAL_OINTMENT | RECTAL | Status: DC | PRN

## 2015-08-15 MED ORDER — OXYTOCIN 10 UNIT/ML IJ SOLN
2.5000 [IU]/h | INTRAMUSCULAR | Status: DC
  Filled 2015-08-15: qty 4

## 2015-08-15 MED ORDER — LIDOCAINE HCL (PF) 1 % IJ SOLN
30.0000 mL | INTRAMUSCULAR | Status: DC | PRN
  Administered 2015-08-15: 30 mL via SUBCUTANEOUS
  Filled 2015-08-15: qty 30

## 2015-08-15 MED ORDER — CITRIC ACID-SODIUM CITRATE 334-500 MG/5ML PO SOLN: 30.0000 mL | ORAL | Status: DC | PRN

## 2015-08-15 MED ORDER — PENICILLIN G POTASSIUM 5000000 UNITS IJ SOLR: 5.0000 10*6.[IU] | Freq: Once | INTRAVENOUS | Status: DC

## 2015-08-15 MED ORDER — METHYLERGONOVINE MALEATE 0.2 MG PO TABS
0.2000 mg | ORAL_TABLET | Freq: Four times a day (QID) | ORAL | Status: DC
  Administered 2015-08-15: 0.2 mg via ORAL
  Filled 2015-08-15: qty 1

## 2015-08-15 MED ORDER — IBUPROFEN 100 MG/5ML PO SUSP
600.0000 mg | Freq: Four times a day (QID) | ORAL | Status: DC
Start: 2015-08-16 — End: 2015-08-17
  Administered 2015-08-16 – 2015-08-17 (×7): 600 mg via ORAL
  Filled 2015-08-15 (×11): qty 30

## 2015-08-15 MED ORDER — SODIUM CHLORIDE 0.9 % IV SOLN
2.0000 g | Freq: Once | INTRAVENOUS | Status: AC
  Administered 2015-08-15: 2 g via INTRAVENOUS
  Filled 2015-08-15: qty 2000

## 2015-08-15 MED ORDER — DIPHENHYDRAMINE HCL 25 MG PO CAPS: 25.0000 mg | ORAL_CAPSULE | Freq: Four times a day (QID) | ORAL | Status: DC | PRN

## 2015-08-15 MED ORDER — FENTANYL CITRATE (PF) 100 MCG/2ML IJ SOLN
100.0000 ug | INTRAMUSCULAR | Status: DC | PRN
  Administered 2015-08-15 (×2): 100 ug via INTRAVENOUS
  Filled 2015-08-15 (×2): qty 2

## 2015-08-15 MED ORDER — OXYCODONE-ACETAMINOPHEN 5-325 MG PO TABS: 1.0000 | ORAL_TABLET | ORAL | Status: DC | PRN

## 2015-08-15 MED ORDER — DIPHENHYDRAMINE HCL 50 MG/ML IJ SOLN: 12.5000 mg | INTRAMUSCULAR | Status: DC | PRN

## 2015-08-15 MED ORDER — LANOLIN HYDROUS EX OINT: TOPICAL_OINTMENT | CUTANEOUS | Status: DC | PRN

## 2015-08-15 MED ORDER — TETANUS-DIPHTH-ACELL PERTUSSIS 5-2.5-18.5 LF-MCG/0.5 IM SUSP: 0.5000 mL | Freq: Once | INTRAMUSCULAR | Status: DC

## 2015-08-15 MED ORDER — FENTANYL 2.5 MCG/ML BUPIVACAINE 1/10 % EPIDURAL INFUSION (WH - ANES)
14.0000 mL/h | INTRAMUSCULAR | Status: DC | PRN
  Filled 2015-08-15: qty 125

## 2015-08-15 MED ORDER — BETAMETHASONE SOD PHOS & ACET 6 (3-3) MG/ML IJ SUSP: 12.0000 mg | Freq: Once | INTRAMUSCULAR | Status: DC

## 2015-08-15 MED ORDER — WITCH HAZEL-GLYCERIN EX PADS: 1.0000 "application " | MEDICATED_PAD | CUTANEOUS | Status: DC | PRN

## 2015-08-15 MED ORDER — PHENYLEPHRINE 40 MCG/ML (10ML) SYRINGE FOR IV PUSH (FOR BLOOD PRESSURE SUPPORT)
80.0000 ug | PREFILLED_SYRINGE | INTRAVENOUS | Status: DC | PRN
  Filled 2015-08-15: qty 20
  Filled 2015-08-15: qty 2

## 2015-08-15 MED ORDER — ONDANSETRON HCL 4 MG/2ML IJ SOLN: 4.0000 mg | INTRAMUSCULAR | Status: DC | PRN

## 2015-08-15 MED ORDER — SENNOSIDES-DOCUSATE SODIUM 8.6-50 MG PO TABS
2.0000 | ORAL_TABLET | ORAL | Status: DC
  Administered 2015-08-16: 2 via ORAL
  Filled 2015-08-15 (×2): qty 2

## 2015-08-15 MED ORDER — PHENYLEPHRINE 40 MCG/ML (10ML) SYRINGE FOR IV PUSH (FOR BLOOD PRESSURE SUPPORT)
80.0000 ug | PREFILLED_SYRINGE | INTRAVENOUS | Status: DC | PRN
  Filled 2015-08-15: qty 2

## 2015-08-15 MED ORDER — BENZOCAINE-MENTHOL 20-0.5 % EX AERO
1.0000 "application " | INHALATION_SPRAY | CUTANEOUS | Status: DC | PRN
  Administered 2015-08-16: 1 via TOPICAL
  Filled 2015-08-15: qty 56

## 2015-08-15 MED ORDER — PENICILLIN G POTASSIUM 5000000 UNITS IJ SOLR
2.5000 10*6.[IU] | INTRAVENOUS | Status: DC
  Filled 2015-08-15 (×3): qty 2.5

## 2015-08-15 MED ORDER — SIMETHICONE 80 MG PO CHEW: 80.0000 mg | CHEWABLE_TABLET | ORAL | Status: DC | PRN

## 2015-08-15 MED ORDER — CEFAZOLIN SODIUM-DEXTROSE 2-3 GM-% IV SOLR
2.0000 g | Freq: Once | INTRAVENOUS | Status: AC
  Administered 2015-08-15: 2 g via INTRAVENOUS
  Filled 2015-08-15: qty 50

## 2015-08-15 MED ORDER — LACTATED RINGERS IV SOLN
INTRAVENOUS | Status: DC
  Administered 2015-08-15: 125 mL/h via INTRAVENOUS

## 2015-08-15 NOTE — H&P (Signed)
LABOR AND DELIVERY ADMISSION HISTORY AND PHYSICAL NOTE  Sylvia Bullock is a 20 y.o. female G2P0100 with IUP at [redacted]w[redacted]d by 19 wk u/s presenting for leakage of fluid. Felt gush at 2 PM today and has had leakage since  She reports positive fetal movement. Denies vaginal bleeding.  Since then has had cramping pains that come and go every 5 minutes or so. No fever.  Prenatal History/Complications:  Past Medical History: History reviewed. No pertinent past medical history.  Past Surgical History: History reviewed. No pertinent past surgical history.  Obstetrical History: OB History    Gravida Para Term Preterm AB TAB SAB Ectopic Multiple Living   0      Obstetric Comments   Delivered viable infant at 21 weeks but died shortly after birth due to prematurity.      Social History: Social History   Social History  . Marital Status: Married    Spouse Name: N/A  . Number of Children: N/A  . Years of Education: N/A   Social History Main Topics  . Smoking status: Never Smoker   . Smokeless tobacco: Never Used  . Alcohol Use: No  . Drug Use: No  . Sexual Activity: Yes   Other Topics Concern  . None   Social History Narrative    Family History: History reviewed. No pertinent family history.  Allergies: No Known Allergies  Prescriptions prior to admission  Medication Sig Dispense Refill Last Dose  . Prenatal Vit-DSS-Fe Fum-FA (SE-NATAL 19) 29-1 MG TABS Take 1 tablet by mouth daily before breakfast. 30 tablet 5 Taking     Review of Systems   All systems reviewed and negative except as stated in HPI  Blood pressure 115/67, pulse 59, temperature 98.2 F (36.8 C), temperature source Oral, resp. rate 20, height 5' 2.5" (1.588 m), weight 113 lb (51.256 kg), last menstrual period 11/27/2014. General appearance: alert, cooperative and appears stated age Lungs: clear to auscultation bilaterally Heart: regular rate and rhythm Abdomen: soft, non-tender; bowel sounds  normal Extremities: No calf swelling or tenderness Presentation: cephalic Fetal monitoring: 140/mod/+a/-d Uterine activity: q 3-5 min  Dilation: 4.5 Effacement (%): 50 Station: -3 Exam by:: Dr. Ashok Pall   Prenatal labs: ABO, Rh: --/--/B POS (02/14 1620) Antibody: NEG (02/14 1620) Rubella: !Error!immune RPR: NON REAC (12/22 1058)  HBsAg: Negative (08/30 0000)  HIV: NONREACTIVE (12/22 1058)  GBS:   unknown 1 hr Glucola: 64 Genetic screening:  declined Anatomy US: wnl  Prenatal Transfer Tool  Maternal Diabetes: No Genetic Screening: Declined Maternal Ultrasounds/Referrals: Normal Fetal Ultrasounds or other Referrals:  None Maternal Substance Abuse:  No Significant Maternal Medications:  None Significant Maternal Lab Results: gbs unknown  Results for orders placed or performed during the hospital encounter of 08/15/15 (from the past 24 hour(s))  CBC   Collection Time: 08/15/15  4:20 PM  Result Value Ref Range   WBC 7.7 4.0 - 10.5 K/uL   RBC 4.14 3.87 - 5.11 MIL/uL   Hemoglobin 12.2 12.0 - 15.0 g/dL   HCT 62.1 (L) 30.8 - 65.7 %   MCV 86.0 78.0 - 100.0 fL   MCH 29.5 26.0 - 34.0 pg   MCHC 34.3 30.0 - 36.0 g/dL   RDW 84.6 96.2 - 95.2 %   Platelets 249 150 - 400 K/uL  Type and screen [facility] [place] [facility]   Collection Time: 08/15/15  4:20 PM  Result Value Ref Range   ABO/RH(D) B POS  Antibody Screen NEG    Sample Expiration 08/18/2015     Patient Active Problem List   Diagnosis Date Noted  . PROM (premature rupture of membranes) 08/15/2015  . Language barrier, speaks [name] only 07/20/2015  . History of preterm delivery, currently pregnant 2015/04/27  . Supervision of high-risk pregnancy April 27, 2015  . Fetal death 2015/03/20    Assessment: Sylvia Bullock is a 20 y.o. G2P0100 at [redacted]w[redacted]d here for pprom at 2 PM today.  #Labor: expectant mgmt #Pain: Eventual epidural #FWB: Cat 1 #ID:  gbs unknown, given prematurity will start ppx. GBS collected for culture. #MOF:   Breast and bottle #MOC: undecided. We discussed IUD as patient wishes to avoid bleeding #Circ:  Declines #PPROM: Grossly ruptured and fern positive. Will give betamethasone and order 2nd dose for 24 hours. No signs triple I.  Silvano Bilis 08/15/2015, 6:37 PM

## 2015-08-15 NOTE — MAU Note (Signed)
Pt felt a gush of fluid around 1400 and has been leaking since.  Denies contractions, small amount of light vaginal bleeding.

## 2015-08-16 LAB — RPR: RPR Ser Ql: NONREACTIVE

## 2015-08-16 MED ORDER — COMPLETENATE 29-1 MG PO CHEW
1.0000 | CHEWABLE_TABLET | Freq: Every day | ORAL | Status: DC
  Administered 2015-08-16 – 2015-08-17 (×2): 1 via ORAL
  Filled 2015-08-16 (×3): qty 1

## 2015-08-16 MED ORDER — METHYLERGONOVINE MALEATE 0.2 MG PO TABS
0.2000 mg | ORAL_TABLET | Freq: Once | ORAL | Status: AC
  Administered 2015-08-16: 0.2 mg via ORAL
  Filled 2015-08-16: qty 1

## 2015-08-16 NOTE — Progress Notes (Signed)
POSTPARTUM PROGRESS NOTE  Post Partum Day 1 Subjective:  Sylvia Bullock is a 20 y.o. Z6X0960 [redacted]w[redacted]d s/p nsvd.  No acute events overnight.  Pt denies problems with ambulating, voiding or po intake.  She denies nausea or vomiting.  Pain is well controlled.  She has had flatus. She has not had bowel movement.  Lochia Small.   Objective: Blood pressure 114/54, pulse 63, temperature 98.4 F (36.9 C), temperature source Oral, resp. rate 16, height 5' 2.5" (1.588 m), weight 113 lb (51.256 kg), last menstrual period 11/27/2014, SpO2 99 %, unknown if currently breastfeeding.  Physical Exam:  General: alert, cooperative and no distress Lochia:normal flow Chest: CTAB Heart: RRR no m/r/g Abdomen: +BS, soft, nontender,  Uterine Fundus: firm, DVT Evaluation: No calf swelling or tenderness Extremities: trace edema   Recent Labs  08/15/15 1620  HGB 12.2  HCT 35.6*    Assessment/Plan:  ASSESSMENT: Sylvia Bullock is a 20 y.o. A5W0981 [redacted]w[redacted]d s/p nsvd, doing well.  Plan for discharge tomorrow   LOS: 1 day   Silvano Bilis 08/16/2015, 6:44 AM

## 2015-08-16 NOTE — Lactation Note (Signed)
This note was copied from a baby's chart. Lactation Consultation Note; Visit with mom with [facility] interpreter [phone] [place] on line. Mom reports some pain through the entire feeding. Baby asleep in mom's arms. To get bath. Encouraged mom to page for assist when baby wakes for next feeding. No questions at present Patient Name: Sylvia Bullock Today's Date: 08/16/2015 Reason for consult: Follow-up assessment;Infant < 6lbs;Late preterm infant   Maternal Data Formula Feeding for Exclusion: No Has patient been taught Hand Expression?: Yes Does the patient have breastfeeding experience prior to this delivery?: No  Feeding Feeding Type: Breast Fed Length of feed: 15 min  LATCH Score/Interventions                      Lactation Tools Discussed/Used Tools: Pump Breast pump type: Double-Electric Breast Pump   Consult Status Consult Status: Follow-up Date: 08/16/15 Follow-up type: In-patient    Pamelia Hoit 08/16/2015, 10:55 AM

## 2015-08-16 NOTE — Progress Notes (Signed)
Teaching completed with parents using [employer] Interpreter [employer] (#[id number]).  Spoke with the mother regarding her pain.  Encouraged mother to use ice packs and Dermaplast spray for perineal pain.  Encouraged her to call out for pain medication as needed.  Completed their birth certificate information sheet and gave to the birth registry.  Discussed with parents the need for the infant to stay in the hospital greater than 48 hours per MD due to gestational age and weight.  Educated the parents on bathing the infant (NT to come bathe the infant in the room and complete hearing screen).  Also educated the parents on all the 24 hour tests for the baby (PKU, TCB, CHS and jaundice).  Lactation spoke with parents about breastfeeding.  Parents do not have supplies for the infant (carseat, crib, clothes, etc).  Spoke with Social Worker and left a message with Ginger in Volunteer Office for supplies or information to give the parents.   Bullock, Sylvia Hubers M 

## 2015-08-16 NOTE — Lactation Note (Signed)
This note was copied from a baby's chart. Lactation Consultation Note  Assisted mom with latch Mom reports she feels sucking with a little pain. Wide open mouth, deep onto the breast. Reviewed pumping with parents Pumping as I left room. No questions at present,   Patient Name: Sylvia Bullock Today's Date: 08/16/2015 Reason for consult: Follow-up assessment   Maternal Data Formula Feeding for Exclusion: No Has patient been taught Hand Expression?: Yes Does the patient have breastfeeding experience prior to this delivery?: No  Feeding Feeding Type: Breast Fed Length of feed: 15 min  LATCH Score/Interventions Latch: Grasps breast easily, tongue down, lips flanged, rhythmical sucking.  Audible Swallowing: A few with stimulation  Type of Nipple: Everted at rest and after stimulation  Comfort (Breast/Nipple): Soft / non-tender     Hold (Positioning): Assistance needed to correctly position infant at breast and maintain latch.  LATCH Score: 8  Lactation Tools Discussed/Used Tools: Pump Breast pump type: Double-Electric Breast Pump Pump Review: Setup, frequency, and cleaning Initiated by:: RN   Consult Status Consult Status: Follow-up Date: 08/17/15 Follow-up type: In-patient    Pamelia Hoit 08/16/2015, 12:26 PM

## 2015-08-16 NOTE — Lactation Note (Signed)
This note was copied from a baby's chart. Lactation Consultation Note; Initial visit with parents. They report baby has been latching well. Baby asleep in visitor's arms at present. Has DEBP setup in room Encouraged to call for latch check at next feeding No questions at present. BF brochure given with resources for support after DC.  Patient Name: Sylvia Bullock Today's Date: 08/16/2015 Reason for consult: Initial assessment;Infant < 6lbs;Late preterm infant   Maternal Data Formula Feeding for Exclusion: No Has patient been taught Hand Expression?: Yes Does the patient have breastfeeding experience prior to this delivery?: No  Feeding Feeding Type: Breast Fed Length of feed: 15 min  LATCH Score/Interventions                      Lactation Tools Discussed/Used Tools: Pump Breast pump type: Double-Electric Breast Pump   Consult Status Consult Status: Follow-up Date: 08/16/15 Follow-up type: In-patient    Pamelia Hoit 08/16/2015, 10:02 AM

## 2015-08-16 NOTE — Progress Notes (Signed)
[employer] used. Questions answered.

## 2015-08-17 ENCOUNTER — Encounter: Payer: Medicaid Other | Admitting: Obstetrics and Gynecology

## 2015-08-17 LAB — CULTURE, BETA STREP (GROUP B ONLY)

## 2015-08-17 MED ORDER — IBUPROFEN 100 MG/5ML PO SUSP: 600.0000 mg | Freq: Four times a day (QID) | ORAL | Status: DC | PRN

## 2015-08-17 NOTE — Lactation Note (Signed)
This note was copied from a baby's chart. Lactation Consultation Note  Patient Name: Sylvia Bullock Today's Date: 08/17/2015 Reason for consult: Follow-up assessment   With this mom of a LPI. Now 37 hours old, and now 36 3/7 weeks CGA, and weighing last evening 5 lbs 3.1 oz. I spoke to parents through [place] interpreter, [employer] # [phone]. I reviewed with mom the importance of pumping,How her baby is too small to protect her milk supply and why, how to use a hand pump, that I would fax [facility] a referral, to change their appointment and that mom will need a DEP. I decreased mom to 21 flanges, and mom pumped first with hand pump, and to do teaching, and then with DEP - Mom's milk has already transitioned in. I explained [facility] loaner program to parents, but they chose a hand pump for discharge. Hopefully, they will be able to get to [facility] within 24 hours of discharge to get a pump. Discharge depends on how baby feeds and his weight. Dr. Tawny Hopping was present during part of my consult, and did some teaching also with parents, explaining how baby needs to stay another day.    Maternal Data    Feeding Feeding Type: Bottle Fed - Formula Nipple Type: Slow - flow  LATCH Score/Interventions          Comfort (Breast/Nipple): Filling, red/small blisters or bruises, mild/mod discomfort  Problem noted: Filling        Lactation Tools Discussed/Used Tools: Flanges Flange Size:  (decreased mom to size 21 with a god fit) Breast pump type: Double-Electric Breast Pump WIC Program: Yes ([facility] - had appointmetn for tomorrow, fax sent - baby not going home today 0- need to call mom, needs DEP nd new appointment) Pump Review: Setup, frequency, and cleaning;Milk Storage;Other (comment) (standard setting, hand expression)   Consult Status Consult Status: Follow-up Date: 08/18/15 Follow-up type: In-patient    [name] 08/17/2015, 10:09 AM

## 2015-08-17 NOTE — Discharge Instructions (Signed)

## 2015-08-17 NOTE — Lactation Note (Addendum)
This note was copied from a baby's chart. Lactation Consultation Note Changed baby's formula to 22 cal. Instead of 19 cal. Similac. Gave slow flow nipples. Encouraged after BF to supplement w/formula. Mom has easily expressed colostrum. Mom sleeping, FOB told me he had formula fed the baby 10ml at 0300. Told him LC was changing the formula to a higher calorie formula. Stated OK  He would use the new milk, reminded to BF first. Patient Name: Sylvia Bullock Today's Date: 08/17/2015 Reason for consult: Follow-up assessment   Maternal Data    Feeding Feeding Type: Formula Nipple Type: Slow - flow Length of feed: 20 min  LATCH Score/Interventions                      Lactation Tools Discussed/Used     Consult Status Consult Status: Follow-up Date: 08/17/15 Follow-up type: In-patient    Charyl Dancer 08/17/2015, 3:45 AM

## 2015-08-17 NOTE — Discharge Summary (Signed)
Obstetric Discharge Summary Reason for Admission: onset of labor Prenatal Procedures: none Intrapartum Procedures: spontaneous vaginal delivery and manual extraction of trailing membrane Postpartum Procedures: none Complications-Operative and Postpartum: 1st degree perineal laceration  Delivery Note At 8:43 PM a viable female was delivered via Vaginal, Spontaneous Delivery (Presentation: Left Occiput Anterior). APGAR: 9, 10; weight 2430g .  Placenta status: Intact, Spontaneous. Manual extraction of trailing membranes x 1 then again due to continued vaginal bleeding. Patient was given Ancef 2g and Methergine. Cord: 3 vessels with the following complications: retained placental membrane removed manually. Ancef 2 g given. Cord pH: not obtained.   Anesthesia: None  Episiotomy: None Lacerations: 1st degree;Perineal-repaired; left labial laceration- repaired; right periurethral laceration- did not require repair Suture Repair: 3.0 and 4.0 vicryl Est. Blood Loss (mL):   Mom to postpartum. Baby to Couplet care / Skin to Skin.  Sylvia Bullock 08/15/2015, 9:24 PM  PE: gen- alert, cooperative Lungs- breathing without effort Heart- RRR abd- fundus firm LE- no s/s DVT  H/H: Lab Results  Component Value Date/Time   HGB 12.2 08/15/2015 04:20 PM   HGB 12.3 02/27/2015   HGB 12.3 02/27/2015   HCT 35.6* 08/15/2015 04:20 PM   HCT 38 02/27/2015   HCT 38 02/27/2015    Discharge Diagnoses: SVD after PPROM  Discharge Information: Date: 01/10/2011 Activity: unrestricted Diet: routine Medications: Ibuprofen Breast feeding:  Yes Condition: stable Instructions: refer to practice specific booklet Discharge to: home   Sylvia Bullock 08/17/2015,9:17 AM   CNM attestation   Sylvia Bullock is a 20 y.o. Z6X0960 s/p SVD.   Pain is well controlled.  Plan for birth control is no method.  Method of Feeding: both  PE:  BP 94/51 mmHg  Pulse 51  Temp(Src) 98.2 F (36.8 C) (Oral)  Resp 18   Ht 5' 2.5" (1.588 m)  Wt 51.256 kg (113 lb)  BMI 20.33 kg/m2  SpO2 99%  LMP 11/27/2014 (Approximate)  Breastfeeding? Unknown Fundus firm  No results for input(s): HGB, HCT in the last 72 hours.   Plan: discharge today - postpartum care discussed - f/u clinic in 6 weeks for postpartum visit   Sylvia Bullock, CNM 2:41 AM

## 2015-08-17 NOTE — Progress Notes (Signed)
Discharge teaching completed with [facility] interpreter Ezekiel Ina  [phone].

## 2015-08-18 ENCOUNTER — Ambulatory Visit: Payer: Self-pay

## 2015-08-18 NOTE — Lactation Note (Signed)
This note was copied from a baby's chart. Lactation Consultation Note  Patient Name: Sylvia Bullock Today's Date: 08/18/2015 Reason for consult: Follow-up assessment;Infant weight loss ([employer] interpreter [phone] , 5% weight loss, baby patient , D/C held )  Baby 62 hours old. Baby being held one more day due to weight . Baby has been receiving mostly bottles and some breast.  Per mom breast are full and heavy. Checked breast with moms permission noted the breast to be warm, boarder line engorged, LC cleaned pump pieces and checked flange size mom has been using #21 , and noted the flanges to be to tight.  Per mom had mentioned her breast were feeling painful and when the flange size increased to #24 , per mom more comfortable and no pain.  Breast softened well ( both ) with 40 plus EBM yield.  LC stressed to mom and dad it is important to stay ahead of the overly full breast to protect and establish milk supply by feeding or pumping every 2-3 hours. MBU RN Francene Finders also aware mom is boarder line engorged and mom breast need to be assessed during the shift and 7p- 7a needs the report to follow through.    Maternal Data Has patient been taught Hand Expression?: Yes  Feeding Feeding Type:  (per mom recently fed a bottle formula )  LATCH Score/Interventions                Intervention(s): Breastfeeding basics reviewed     Lactation Tools Discussed/Used Tools: Pump Flange Size: 24 (#21 flange to small now that milk is in , changed to #24 . better ffiot , pe rmom comfortable ) Breast pump type: Double-Electric Breast Pump   Consult Status Consult Status: Follow-up Date: 08/19/15 Follow-up type: In-patient    Kathrin Greathouse 08/18/2015, 11:14 AM

## 2015-08-19 ENCOUNTER — Ambulatory Visit: Payer: Self-pay

## 2015-08-19 NOTE — Lactation Note (Signed)
This note was copied from a baby's chart. Lactation Consultation Note  Patient Name: Sylvia Bullock Today's Date: 08/19/2015 Reason for consult: Follow-up assessment   With this mom of a now 36 5/7 week baby, weighing 5 pounds 3.8 ounces at discharge to home today. Mom had not been pumping but once a day, and I reviewed with her the importance of pumping at least 8 times a day, around the clock.Mom pumped this morning a expressed about 100 ml's of transitional milk. Mom encouraged to use her EBM instead of formula, when possible.  Teach back teaching done through interpreter from [employer] # [phone]. Mom was able to do a [facility] loaner pump,and parents were advised to go to [facility]  and after their peds appointment on Monday, and make an appointment in person, with use of  phone interpreter. Mom is aware she can come back to see lactation, or work with [facility] peer counselors on transitioning the baby to breastfeeding.    Maternal Data    Feeding    LATCH Score/Interventions                      Lactation Tools Discussed/Used [place]IC Program:  ([place] Pump Review: Setup, frequency, and cleaning   Consult Status Consult Status: Complete Follow-up type: Call as needed    [name] 08/19/2015, 11:00 AM

## 2015-09-20 ENCOUNTER — Ambulatory Visit: Payer: Medicaid Other | Admitting: Family

## 2016-04-25 ENCOUNTER — Encounter: Payer: Medicaid Other | Admitting: Family Medicine

## 2016-06-04 DIAGNOSIS — O4593 Premature separation of placenta, unspecified, third trimester: Secondary | ICD-10-CM | POA: Insufficient documentation

## 2017-07-01 NOTE — L&D Delivery Note (Signed)
Patient is 22 y.o. Z6X0960G4P0302 4119w0d admitted for IOL for IUGR. S/p IOL with Pitocin. SROM <5 min prior to delivery.  Prenatal course also complicated by IUGR, PTD.  GBS negative  Delivery Note At 11:08 PM a viable female was delivered via Vaginal, Spontaneous (Presentation: ROA).  APGAR: 8, 9; weight pending skin to skin.   Placenta status: Intact.  Cord: 3V with the following complications: None.  Cord pH: N/A  Anesthesia: None Episiotomy: None Lacerations: None  Est. Blood Loss (mL): 1000 - methergine .2mg  and cytotec 800mcg buccally given  Mom to postpartum.  Baby to Couplet care / Skin to Skin.  Caryl AdaJazma Abdulhadi Stopa, DO 12/26/2017, 11:35 PM OB Fellow [facility]

## 2017-09-23 LAB — OB RESULTS CONSOLE ANTIBODY SCREEN: ANTIBODY SCREEN: NEGATIVE

## 2017-09-23 LAB — OB RESULTS CONSOLE RPR: RPR: NONREACTIVE

## 2017-10-03 ENCOUNTER — Encounter: Payer: Self-pay | Admitting: Family Medicine

## 2017-10-13 ENCOUNTER — Encounter: Payer: Self-pay | Admitting: Obstetrics and Gynecology

## 2017-10-30 ENCOUNTER — Other Ambulatory Visit (HOSPITAL_COMMUNITY)
Admission: RE | Admit: 2017-10-30 | Discharge: 2017-10-30 | Disposition: A | Discharge status: Home / Self Care | Payer: Medicaid Other | Source: Ambulatory Visit | Attending: Family Medicine | Admitting: Family Medicine

## 2017-10-30 ENCOUNTER — Encounter: Payer: Self-pay | Admitting: Family Medicine

## 2017-10-30 ENCOUNTER — Ambulatory Visit (INDEPENDENT_AMBULATORY_CARE_PROVIDER_SITE_OTHER): Payer: Medicaid Other | Admitting: Family Medicine

## 2017-10-30 VITALS — BP 109/46 | HR 59 | Wt 110.0 lb

## 2017-10-30 DIAGNOSIS — O09899 Supervision of other high risk pregnancies, unspecified trimester: Secondary | ICD-10-CM

## 2017-10-30 DIAGNOSIS — O09213 Supervision of pregnancy with history of pre-term labor, third trimester: Secondary | ICD-10-CM | POA: Diagnosis not present

## 2017-10-30 DIAGNOSIS — Z789 Other specified health status: Secondary | ICD-10-CM | POA: Diagnosis not present

## 2017-10-30 DIAGNOSIS — O0993 Supervision of high risk pregnancy, unspecified, third trimester: Secondary | ICD-10-CM | POA: Diagnosis not present

## 2017-10-30 DIAGNOSIS — O09219 Supervision of pregnancy with history of pre-term labor, unspecified trimester: Secondary | ICD-10-CM

## 2017-10-30 DIAGNOSIS — Z603 Acculturation difficulty: Secondary | ICD-10-CM

## 2017-10-30 MED ORDER — LORATADINE 10 MG PO TABS: 10.0000 mg | ORAL_TABLET | Freq: Every day | ORAL | 1 refills | Status: DC

## 2017-10-30 NOTE — Progress Notes (Signed)
  Subjective:  Sylvia Bullock is a Z6X0960 [redacted]w[redacted]d being seen today for her first obstetrical visit at our office. She had an initial appointment at the [facility], but referred here due to 2 previous preterm deliveries and a previable PPROM with delivery at 21 weeks. Patient does not intend to breast feed. Pregnancy history fully reviewed.  Patient reports allergy symptoms.  BP (!) 109/46   Pulse (!) 59   Wt 110 lb (49.9 kg)   LMP 03/28/2017 (Exact Date)   BMI 19.80 kg/m   HISTORY: OB History  Gravida Para Term Preterm AB Living  SAB TAB Ectopic Multiple Live Births        0 3    # Outcome Date GA Lbr Len/2nd Weight Sex Delivery Anes PTL Lv  4 Current           3 Preterm 06/02/16    F Vag-Spont  Y LIV  2 Preterm 08/15/15 [redacted]w[redacted]d / 00:15 5 lb 5.7 oz (2.43 kg) M Vag-Spont None  LIV  1 Preterm 03/30/14 [redacted]w[redacted]d   F Vag-Spont None  ND    Obstetric Comments  Delivered infant at 21 weeks but died shortly after birth due to prematurity.    Past Medical History:  Diagnosis Date  . Anemia     History reviewed. No pertinent surgical history.  History reviewed. No pertinent family history.   Exam    Uterus:     Pelvic Exam: Done at [facility]  System: Breast:  Done at [facility]   Skin: normal coloration and turgor, no rashes    Neurologic: oriented, gait normal; reflexes normal and symmetric   Extremities: normal strength, tone, and muscle mass   HEENT PERRLA and extra ocular movement intact   Mouth/Teeth mucous membranes moist, pharynx normal without lesions   Neck supple and no masses   Cardiovascular: regular rate and rhythm, no murmurs or gallops   Respiratory:  appears well, vitals normal, no respiratory distress, acyanotic, normal RR, ear and throat exam is normal, neck free of mass or lymphadenopathy, chest clear, no wheezing, crepitations, rhonchi, normal symmetric air entry   Abdomen: soft, non-tender; bowel sounds normal; no masses,  no organomegaly      Assessment:    Pregnancy: [id number] Patient Active Problem List   Diagnosis Date Noted  . Language barrier, speaks [name] only 07/20/2015  . History of preterm delivery, currently pregnant 04-11-2015  . Supervision of high-risk pregnancy Apr 11, 2015  . Fetal death Mar 04, 2015      Plan:   1. Supervision of high risk pregnancy in third trimester Records reviewed from HD. Only have partial labs - will obtain rest. Patient states that she has had a 1hr GTT. Will schedule [place].  - loratadine (CLARITIN) 10 MG tablet; Take 1 tablet (10 mg total) by mouth daily.  Dispense: 90 tablet; Refill: 1 - [place] MFM OB DETAIL +14 WK; Future  2. History of preterm delivery, currently pregnant Discussed that we were unable to give her Makena at this stage in the pregnancy. Will watch for labor symptoms  3. Language barrier, speaks [name] only Interpreter used     Problem list reviewed and updated. 75% of 45 min visit spent on counseling and coordination of care.     Levie Heritage 10/30/2017

## 2017-10-31 LAB — OBSTETRIC PANEL, INCLUDING HIV
ANTIBODY SCREEN: NEGATIVE
BASOS ABS: 0 10*3/uL (ref 0.0–0.2)
Basos: 0 %
EOS (ABSOLUTE): 0.2 10*3/uL (ref 0.0–0.4)
Eos: 3 %
HIV SCREEN 4TH GENERATION: NONREACTIVE
Hematocrit: 35.2 % (ref 34.0–46.6)
Hemoglobin: 10.7 g/dL — ABNORMAL LOW (ref 11.1–15.9)
Hepatitis B Surface Ag: NEGATIVE
IMMATURE GRANS (ABS): 0 10*3/uL (ref 0.0–0.1)
Immature Granulocytes: 1 %
LYMPHS: 23 %
Lymphocytes Absolute: 1.4 10*3/uL (ref 0.7–3.1)
MCH: 24.8 pg — ABNORMAL LOW (ref 26.6–33.0)
MCHC: 30.4 g/dL — AB (ref 31.5–35.7)
MCV: 82 fL (ref 79–97)
MONOCYTES: 8 %
MONOS ABS: 0.5 10*3/uL (ref 0.1–0.9)
Neutrophils Absolute: 4 10*3/uL (ref 1.4–7.0)
Neutrophils: 65 %
PLATELETS: 287 10*3/uL (ref 150–379)
RBC: 4.31 x10E6/uL (ref 3.77–5.28)
RDW: 18.9 % — AB (ref 12.3–15.4)
RPR Ser Ql: NONREACTIVE
RUBELLA: 22.8 {index} (ref >0.99)
Rh Factor: POSITIVE
WBC: 6.2 10*3/uL (ref 3.4–10.8)

## 2017-11-01 LAB — CULTURE, URINE COMPREHENSIVE

## 2017-11-03 ENCOUNTER — Ambulatory Visit (HOSPITAL_COMMUNITY)
Admission: RE | Admit: 2017-11-03 | Discharge: 2017-11-03 | Disposition: A | Discharge status: Home / Self Care | Payer: Medicaid Other | Source: Ambulatory Visit | Attending: Family Medicine | Admitting: Family Medicine

## 2017-11-03 ENCOUNTER — Other Ambulatory Visit: Payer: Self-pay | Admitting: Family Medicine

## 2017-11-03 ENCOUNTER — Encounter (HOSPITAL_COMMUNITY): Payer: Self-pay

## 2017-11-03 DIAGNOSIS — Z364 Encounter for antenatal screening for fetal growth retardation: Secondary | ICD-10-CM | POA: Insufficient documentation

## 2017-11-03 DIAGNOSIS — O0993 Supervision of high risk pregnancy, unspecified, third trimester: Secondary | ICD-10-CM

## 2017-11-03 DIAGNOSIS — Z3A31 31 weeks gestation of pregnancy: Secondary | ICD-10-CM | POA: Diagnosis not present

## 2017-11-03 DIAGNOSIS — O0933 Supervision of pregnancy with insufficient antenatal care, third trimester: Secondary | ICD-10-CM | POA: Diagnosis not present

## 2017-11-03 DIAGNOSIS — Z363 Encounter for antenatal screening for malformations: Secondary | ICD-10-CM

## 2017-11-03 DIAGNOSIS — O09213 Supervision of pregnancy with history of pre-term labor, third trimester: Secondary | ICD-10-CM | POA: Diagnosis not present

## 2017-11-03 DIAGNOSIS — O365931 Maternal care for other known or suspected poor fetal growth, third trimester, fetus 1: Secondary | ICD-10-CM

## 2017-11-03 LAB — GC/CHLAMYDIA PROBE AMP (~~LOC~~) NOT AT ARMC
Chlamydia: NEGATIVE
Neisseria Gonorrhea: NEGATIVE

## 2017-11-04 ENCOUNTER — Other Ambulatory Visit (HOSPITAL_COMMUNITY): Payer: Self-pay | Admitting: *Deleted

## 2017-11-04 DIAGNOSIS — O365930 Maternal care for other known or suspected poor fetal growth, third trimester, not applicable or unspecified: Secondary | ICD-10-CM

## 2017-11-07 ENCOUNTER — Ambulatory Visit (INDEPENDENT_AMBULATORY_CARE_PROVIDER_SITE_OTHER): Payer: Medicaid Other | Admitting: Obstetrics & Gynecology

## 2017-11-07 VITALS — BP 96/56 | HR 64 | Wt 111.0 lb

## 2017-11-07 DIAGNOSIS — O09219 Supervision of pregnancy with history of pre-term labor, unspecified trimester: Secondary | ICD-10-CM

## 2017-11-07 DIAGNOSIS — Z789 Other specified health status: Secondary | ICD-10-CM

## 2017-11-07 DIAGNOSIS — O0992 Supervision of high risk pregnancy, unspecified, second trimester: Secondary | ICD-10-CM

## 2017-11-07 DIAGNOSIS — O365920 Maternal care for other known or suspected poor fetal growth, second trimester, not applicable or unspecified: Secondary | ICD-10-CM

## 2017-11-07 DIAGNOSIS — O09899 Supervision of other high risk pregnancies, unspecified trimester: Secondary | ICD-10-CM

## 2017-11-07 DIAGNOSIS — Z23 Encounter for immunization: Secondary | ICD-10-CM

## 2017-11-07 DIAGNOSIS — O09212 Supervision of pregnancy with history of pre-term labor, second trimester: Secondary | ICD-10-CM

## 2017-11-07 NOTE — Progress Notes (Signed)
   PRENATAL VISIT NOTE  Subjective:  Sylvia Bullock is a 22 y.o. [id number] at [redacted]w[redacted]d being seen today for ongoing prenatal care.  She is currently monitored for the following issues for this high-risk pregnancy and has Fetal death; History of preterm delivery, currently pregnant; Supervision of high-risk pregnancy; and Language barrier, speaks [name] only on their problem list.  Patient reports no complaints.  Contractions: Not present. Vag. Bleeding: None.   . Denies leaking of fluid.   The following portions of the patient's history were reviewed and updated as appropriate: allergies, current medications, past family history, past medical history, past social history, past surgical history and problem list. Problem list updated.  Objective:   Vitals:   11/07/17 0939  BP: (!) 96/56  Pulse: 64  Weight: 111 lb (50.3 kg)    Fetal Status:           General:  Alert, oriented and cooperative. Patient is in no acute distress.  Skin: Skin is warm and dry. No rash noted.   Cardiovascular: Normal heart rate noted  Respiratory: Normal respiratory effort, no problems with respiration noted  Abdomen: Soft, gravid, appropriate for gestational age.  Pain/Pressure: Absent     Pelvic: Cervical exam deferred        Extremities: Normal range of motion.  Edema: None  Mental Status: Normal mood and affect. Normal behavior. Normal judgment and thought content.   Assessment and Plan:  Pregnancy: [id number] at [redacted]w[redacted]d  1. Supervision of high risk pregnancy in second trimester  2. Language barrier, speaks [name] only The entire visit was completed with the use of a video interpreter  3. History of preterm delivery, currently pregnant  4. S<D Weekly dopplers with BPP  11/03/2017 Impression  Singleton intrauterine pregnancy at 31+3 weeks with  suspected IUGR here for evaluation  Review of the anatomy shows no sonographic markers for  aneuploidy or structural anomalies  All relevant fetal anatomy has been  visualized  Amniotic fluid volume is normal  Estimated fetal weight shows growth in the percentile; he  abdominal circumference is in the 5th percentile  Normal umbilical artery dopplers with no absent or reversed  end-diastolic flow  BPP 8/8 ---------------------------------------------------------------------- Recommendations  Recommend follow-up ultrasound examination in 1 week for  BPP and doppler studies   Preterm labor symptoms and general obstetric precautions including but not limited to vaginal bleeding, contractions, leaking of fluid and fetal movement were reviewed in detail with the patient. Please refer to After Visit Summary for other counseling recommendations.  Return in about 2 weeks (around 11/21/2017).  Future Appointments  Date Time Provider Department Center  11/10/2017 10:30 AM [facility] [place] 1 [facility]-MFCUS MFC-US  11/17/2017 11:00 AM [facility] [place] 3 WH-MFCUS MFC-US  11/25/2017 11:15 AM [facility] [place] 4 WH-MFCUS MFC-US    Willodean Rosenthal, MD

## 2017-11-07 NOTE — Progress Notes (Signed)
Interpreter [phone] used. Armandina Stammer RN

## 2017-11-10 ENCOUNTER — Ambulatory Visit (HOSPITAL_COMMUNITY)
Admission: RE | Admit: 2017-11-10 | Discharge: 2017-11-10 | Disposition: A | Discharge status: Home / Self Care | Payer: Medicaid Other | Source: Ambulatory Visit | Attending: Family Medicine | Admitting: Family Medicine

## 2017-11-10 ENCOUNTER — Encounter (HOSPITAL_COMMUNITY): Payer: Self-pay

## 2017-11-10 DIAGNOSIS — Z3A32 32 weeks gestation of pregnancy: Secondary | ICD-10-CM | POA: Insufficient documentation

## 2017-11-10 DIAGNOSIS — O365990 Maternal care for other known or suspected poor fetal growth, unspecified trimester, not applicable or unspecified: Secondary | ICD-10-CM | POA: Insufficient documentation

## 2017-11-10 DIAGNOSIS — O365930 Maternal care for other known or suspected poor fetal growth, third trimester, not applicable or unspecified: Secondary | ICD-10-CM | POA: Diagnosis present

## 2017-11-12 ENCOUNTER — Telehealth: Payer: Self-pay

## 2017-11-12 DIAGNOSIS — O09292 Supervision of pregnancy with other poor reproductive or obstetric history, second trimester: Secondary | ICD-10-CM

## 2017-11-12 MED ORDER — SE-NATAL 19 29-1 MG PO TABS: 1.0000 | ORAL_TABLET | Freq: Every day | ORAL | 5 refills | Status: DC

## 2017-11-12 NOTE — Telephone Encounter (Signed)
Patient requesting prenatal vitamins. Armandina Stammer RN

## 2017-11-17 ENCOUNTER — Other Ambulatory Visit (HOSPITAL_COMMUNITY): Payer: Self-pay | Admitting: Obstetrics and Gynecology

## 2017-11-17 ENCOUNTER — Encounter (HOSPITAL_COMMUNITY): Payer: Self-pay

## 2017-11-17 ENCOUNTER — Ambulatory Visit (HOSPITAL_COMMUNITY)
Admission: RE | Admit: 2017-11-17 | Discharge: 2017-11-17 | Disposition: A | Discharge status: Home / Self Care | Payer: Medicaid Other | Source: Ambulatory Visit | Attending: Obstetrics & Gynecology | Admitting: Obstetrics & Gynecology

## 2017-11-17 DIAGNOSIS — O09899 Supervision of other high risk pregnancies, unspecified trimester: Secondary | ICD-10-CM

## 2017-11-17 DIAGNOSIS — O365930 Maternal care for other known or suspected poor fetal growth, third trimester, not applicable or unspecified: Secondary | ICD-10-CM

## 2017-11-17 DIAGNOSIS — O09219 Supervision of pregnancy with history of pre-term labor, unspecified trimester: Secondary | ICD-10-CM | POA: Insufficient documentation

## 2017-11-17 DIAGNOSIS — O0933 Supervision of pregnancy with insufficient antenatal care, third trimester: Secondary | ICD-10-CM | POA: Insufficient documentation

## 2017-11-17 DIAGNOSIS — Z3A33 33 weeks gestation of pregnancy: Secondary | ICD-10-CM | POA: Insufficient documentation

## 2017-11-21 ENCOUNTER — Inpatient Hospital Stay (HOSPITAL_COMMUNITY)
Admission: AD | Admit: 2017-11-21 | Discharge: 2017-11-21 | Disposition: A | Discharge status: Home / Self Care | Payer: Medicaid Other | Source: Ambulatory Visit | Attending: Obstetrics and Gynecology | Admitting: Obstetrics and Gynecology

## 2017-11-21 ENCOUNTER — Encounter: Payer: Self-pay | Admitting: Advanced Practice Midwife

## 2017-11-21 ENCOUNTER — Ambulatory Visit (INDEPENDENT_AMBULATORY_CARE_PROVIDER_SITE_OTHER): Payer: Medicaid Other | Admitting: Advanced Practice Midwife

## 2017-11-21 ENCOUNTER — Encounter (HOSPITAL_COMMUNITY): Payer: Self-pay | Admitting: *Deleted

## 2017-11-21 DIAGNOSIS — O4703 False labor before 37 completed weeks of gestation, third trimester: Secondary | ICD-10-CM | POA: Diagnosis not present

## 2017-11-21 DIAGNOSIS — Z3A34 34 weeks gestation of pregnancy: Secondary | ICD-10-CM | POA: Insufficient documentation

## 2017-11-21 DIAGNOSIS — O4690 Antepartum hemorrhage, unspecified, unspecified trimester: Secondary | ICD-10-CM

## 2017-11-21 DIAGNOSIS — R350 Frequency of micturition: Secondary | ICD-10-CM

## 2017-11-21 DIAGNOSIS — O365920 Maternal care for other known or suspected poor fetal growth, second trimester, not applicable or unspecified: Secondary | ICD-10-CM | POA: Diagnosis not present

## 2017-11-21 DIAGNOSIS — O4693 Antepartum hemorrhage, unspecified, third trimester: Secondary | ICD-10-CM | POA: Insufficient documentation

## 2017-11-21 DIAGNOSIS — R109 Unspecified abdominal pain: Secondary | ICD-10-CM | POA: Diagnosis present

## 2017-11-21 LAB — URINALYSIS, ROUTINE W REFLEX MICROSCOPIC
BILIRUBIN URINE: NEGATIVE
Bacteria, UA: NONE SEEN
Glucose, UA: NEGATIVE mg/dL
HGB URINE DIPSTICK: NEGATIVE
KETONES UR: NEGATIVE mg/dL
NITRITE: NEGATIVE
PH: 7 (ref 5.0–8.0)
Protein, ur: NEGATIVE mg/dL
SPECIFIC GRAVITY, URINE: 1.013 (ref 1.005–1.030)

## 2017-11-21 MED ORDER — BETAMETHASONE SOD PHOS & ACET 6 (3-3) MG/ML IJ SUSP
12.0000 mg | Freq: Once | INTRAMUSCULAR | Status: AC
  Administered 2017-11-21: 12 mg via INTRAMUSCULAR
  Filled 2017-11-21: qty 2

## 2017-11-21 NOTE — MAU Note (Signed)
Pt sent from [facility] due to SVE 2 at 34 weeks. Denies bleeding some pain with urination

## 2017-11-21 NOTE — Discharge Instructions (Signed)
Preterm Labor and Birth Information °Pregnancy normally lasts 39-41 weeks. Preterm labor is when labor starts early. It starts before you have been pregnant for 37 whole weeks. °What are the risk factors for preterm labor? °Preterm labor is more likely to occur in women who: °· Have an infection while pregnant. °· Have a cervix that is short. °· Have gone into preterm labor before. °· Have had surgery on their cervix. °· Are younger than age 22. °· Are older than age 35. °· Are African American. °· Are pregnant with two or more babies. °· Take street drugs while pregnant. °· Smoke while pregnant. °· Do not gain enough weight while pregnant. °· Got pregnant right after another pregnancy. ° °What are the symptoms of preterm labor? °Symptoms of preterm labor include: °· Cramps. The cramps may feel like the cramps some women get during their period. The cramps may happen with watery poop (diarrhea). °· Pain in the belly (abdomen). °· Pain in the lower back. °· Regular contractions or tightening. It may feel like your belly is getting tighter. °· Pressure in the lower belly that seems to get stronger. °· More fluid (discharge) leaking from the vagina. The fluid may be watery or bloody. °· Water breaking. ° °Why is it important to notice signs of preterm labor? °Babies who are born early may not be fully developed. They have a higher chance for: °· Long-term heart problems. °· Long-term lung problems. °· Trouble controlling body systems, like breathing. °· Bleeding in the brain. °· A condition called cerebral palsy. °· Learning difficulties. °· Death. ° °These risks are highest for babies who are born before 34 weeks of pregnancy. °How is preterm labor treated? °Treatment depends on: °· How long you were pregnant. °· Your condition. °· The health of your baby. ° °Treatment may involve: °· Having a stitch (suture) placed in your cervix. When you give birth, your cervix opens so the baby can come out. The stitch keeps the  cervix from opening too soon. °· Staying at the hospital. °· Taking or getting medicines, such as: °? Hormone medicines. °? Medicines to stop contractions. °? Medicines to help the baby’s lungs develop. °? Medicines to prevent your baby from having cerebral palsy. ° °What should I do if I am in preterm labor? °If you think you are going into labor too soon, call your doctor right away. °How can I prevent preterm labor? °· Do not use any tobacco products. °? Examples of these are cigarettes, chewing tobacco, and e-cigarettes. °? If you need help quitting, ask your doctor. °· Do not use street drugs. °· Do not use any medicines unless you ask your doctor if they are safe for you. °· Talk with your doctor before taking any herbal supplements. °· Make sure you gain enough weight. °· Watch for infection. If you think you might have an infection, get it checked right away. °· If you have gone into preterm labor before, tell your doctor. °This information is not intended to replace advice given to you by your health care provider. Make sure you discuss any questions you have with your health care provider. °Document Released: 09/13/2008 Document Revised: 11/28/2015 Document Reviewed: 11/08/2015 °Elsevier Interactive Patient Education © 2018 [employer] ° ° °Braxton Hicks Contractions °Contractions of the uterus can occur throughout pregnancy, but they are not always a sign that you are in labor. You may have practice contractions called Braxton Hicks contractions. These false labor contractions are sometimes confused with true labor. °  What are Braxton Hicks contractions? °Braxton Hicks contractions are tightening movements that occur in the muscles of the uterus before labor. Unlike true labor contractions, these contractions do not result in opening (dilation) and thinning of the cervix. Toward the end of pregnancy (32-34 weeks), Braxton Hicks contractions can happen more often and may become stronger. These  contractions are sometimes difficult to tell apart from true labor because they can be very uncomfortable. You should not feel embarrassed if you go to the hospital with false labor. °Sometimes, the only way to tell if you are in true labor is for your health care provider to look for changes in the cervix. The health care provider will do a physical exam and may monitor your contractions. If you are not in true labor, the exam should show that your cervix is not dilating and your water has not broken. °If there are other health problems associated with your pregnancy, it is completely safe for you to be sent home with false labor. You may continue to have Braxton Hicks contractions until you go into true labor. °How to tell the difference between true labor and false labor °True labor °· Contractions last 30-70 seconds. °· Contractions become very regular. °· Discomfort is usually felt in the top of the uterus, and it spreads to the lower abdomen and low back. °· Contractions do not go away with walking. °· Contractions usually become more intense and increase in frequency. °· The cervix dilates and gets thinner. °False labor °· Contractions are usually shorter and not as strong as true labor contractions. °· Contractions are usually irregular. °· Contractions are often felt in the front of the lower abdomen and in the groin. °· Contractions may go away when you walk around or change positions while lying down. °· Contractions get weaker and are shorter-lasting as time goes on. °· The cervix usually does not dilate or become thin. °Follow these instructions at home: °· Take over-the-counter and prescription medicines only as told by your health care provider. °· Keep up with your usual exercises and follow other instructions from your health care provider. °· Eat and drink lightly if you think you are going into labor. °· If Braxton Hicks contractions are making you uncomfortable: °? Change your position from lying  down or resting to walking, or change from walking to resting. °? Sit and rest in a tub of warm water. °? Drink enough fluid to keep your urine pale yellow. Dehydration may cause these contractions. °? Do slow and deep breathing several times an hour. °· Keep all follow-up prenatal visits as told by your health care provider. This is important. °Contact a health care provider if: °· You have a fever. °· You have continuous pain in your abdomen. °Get help right away if: °· Your contractions become stronger, more regular, and closer together. °· You have fluid leaking or gushing from your vagina. °· You pass blood-tinged mucus (bloody show). °· You have bleeding from your vagina. °· You have low back pain that you never had before. °· You feel your baby’s head pushing down and causing pelvic pressure. °· Your baby is not moving inside you as much as it used to. °Summary °· Contractions that occur before labor are called Braxton Hicks contractions, false labor, or practice contractions. °· Braxton Hicks contractions are usually shorter, weaker, farther apart, and less regular than true labor contractions. True labor contractions usually become progressively stronger and regular and they become more frequent. °· Manage discomfort   from [facility] contractions by changing position, resting in a warm bath, drinking plenty of water, or practicing deep breathing. This information is not intended to replace advice given to you by your health care provider. Make sure you discuss any questions you have with your health care provider. Document Released: 10/31/2016 Document Revised: 10/31/2016 Document Reviewed: 10/31/2016 Elsevier Interactive Patient Education  2018 [employer].   Safe Medications in Pregnancy   Acne: Benzoyl Peroxide Salicylic Acid  Backache/Headache: Tylenol: 2 regular strength every 4 hours OR              2 Extra strength every 6 hours  Colds/Coughs/Allergies: Benadryl (alcohol free) 25  mg every 6 hours as needed Breath right strips Claritin Cepacol throat lozenges Chloraseptic throat spray Cold-Eeze- up to three times per day Cough drops, alcohol free Flonase (by prescription only) Guaifenesin Mucinex Robitussin DM (plain only, alcohol free) Saline nasal spray/drops Sudafed (pseudoephedrine) & Actifed ** use only after [redacted] weeks gestation and if you do not have high blood pressure Tylenol Vicks Vaporub Zinc lozenges Zyrtec   Constipation: Colace Ducolax suppositories Fleet enema Glycerin suppositories Metamucil Milk of magnesia Miralax Senokot Smooth move tea  Diarrhea: Kaopectate Imodium A-D  *NO pepto Bismol  Hemorrhoids: Anusol Anusol HC Preparation H Tucks  Indigestion: Tums Maalox Mylanta Zantac  Pepcid  Insomnia: Benadryl (alcohol free)  every 6 hours as needed Tylenol PM Unisom, no Gelcaps  Leg Cramps: Tums MagGel  Nausea/Vomiting:  Bonine Dramamine Emetrol Ginger extract Sea bands Meclizine  Nausea medication to take during pregnancy:  Unisom (doxylamine succinate 25 mg tablets) Take one tablet daily at bedtime. If symptoms are not adequately controlled, the dose can be increased to a maximum recommended dose of two tablets daily (1/2 tablet in the morning, 1/2 tablet mid-afternoon and one at bedtime). Vitamin B6  tablets. Take one tablet twice a day (up to 200 mg per day).  Skin Rashes: Aveeno products Benadryl cream or  every 6 hours as needed Calamine Lotion 1% cortisone cream  Yeast infection: Gyne-lotrimin 7 Monistat 7   **If taking multiple medications, please check labels to avoid duplicating the same active ingredients **take medication as directed on the label ** Do not exceed 4000 mg of tylenol in 24 hours **Do not take medications that contain aspirin or ibuprofen

## 2017-11-21 NOTE — Progress Notes (Signed)
Interpreter used  

## 2017-11-21 NOTE — MAU Provider Note (Signed)
History     CSN: [id number]  Arrival date and time: 11/21/17 1203   First Provider Initiated Contact with Patient 11/21/17 1330      Chief Complaint  Patient presents with  . Abdominal Pain   HPI Sylvia Bullock is a 22 y.o. [id number] at [redacted]w[redacted]d who presents from the office for evaluation of possible preterm labor. She reported lower back pain and pelvic pressure and was found to be 2cm in the office. She has a history of 3 preterm deliveries. She denies any contractions. Denies vaginal bleeding or leaking of fluid. Reports good fetal movement. She is also reporting hemorrhoids that are causing discomfort.  OB History    Gravida  4   Para  3   Term      Preterm  3   AB      Living  2     SAB      TAB      Ectopic      Multiple  0   Live Births  3        Obstetric Comments  Delivered infant at 21 weeks but died shortly after birth due to prematurity.        Past Medical History:  Diagnosis Date  . Anemia     History reviewed. No pertinent surgical history.  No family history on file.  Social History   Tobacco Use  . Smoking status: Never Smoker  . Smokeless tobacco: Never Used  Substance Use Topics  . Alcohol use: No  . Drug use: No    Allergies: No Known Allergies  Medications Prior to Admission  Medication Sig Dispense Refill Last Dose  . loratadine (CLARITIN) 10 MG tablet Take 1 tablet (10 mg total) by mouth daily. 90 tablet 1 Taking  . Prenatal Vit-DSS-Fe Fum-FA (SE-NATAL 19) 29-1 MG TABS Take 1 tablet by mouth daily before breakfast. 30 tablet 5 Taking    Review of Systems  Constitutional: Negative.  Negative for fatigue and fever.  HENT: Negative.   Respiratory: Negative.  Negative for shortness of breath.   Cardiovascular: Negative.  Negative for chest pain.  Gastrointestinal: Positive for rectal pain. Negative for abdominal pain, constipation, diarrhea, nausea and vomiting.  Genitourinary: Negative.  Negative for dysuria, vaginal bleeding  and vaginal discharge.  Neurological: Negative.  Negative for dizziness and headaches.   Physical Exam   Blood pressure 105/61, pulse 64, temperature 97.6 F (36.4 C), temperature source Oral, resp. rate 15, height  (1.575 m), weight 112 lb (50.8 kg), last menstrual period 03/28/2017, SpO2 100 %, unknown if currently breastfeeding.  Physical Exam  Nursing note and vitals reviewed. Constitutional: She is oriented to person, place, and time. She appears well-developed and well-nourished. No distress.  HENT:  Head: Normocephalic.  Eyes: Pupils are equal, round, and reactive to light.  Cardiovascular: Normal rate, regular rhythm and normal heart sounds.  Respiratory: Effort normal and breath sounds normal. No respiratory distress.  GI: Soft. Bowel sounds are normal. She exhibits no distension. There is no tenderness.  Genitourinary:  Genitourinary Comments: One small external hemorrhoid noted  Neurological: She is alert and oriented to person, place, and time.  Skin: Skin is warm and dry.  Psychiatric: She has a normal mood and affect. Her behavior is normal. Judgment and thought content normal.   Dilation: 2 Effacement (%): 80 Station: -3 Exam by:: C Adaia Matthies CNM  Fetal Tracing:  Baseline: 120  Variability: moderate Accels: 15x15 Decels: none  Toco: ui with occasional uc's  MAU Course  Procedures  MDM No change in cervical exam since exam at 1030am No regular contractions or signs of preterm labor at this time. BMZ  Assessment and Plan   1. Preterm uterine contractions in third trimester, antepartum   2. [redacted] weeks gestation of pregnancy   3. Hemorrhage affecting pregnancy, antepartum    -Discharge home in stable condition -OTC options for hemorrhoid management reviewed with patient -Preterm labor precautions discussed -Patient advised to follow-up with MAU tomorrow for second dose of BMZ -Patient may return to MAU as needed or if her condition were to change or  worsen  Rolm Bookbinder CNM 11/21/2017, 1:30 PM

## 2017-11-21 NOTE — Patient Instructions (Signed)

## 2017-11-21 NOTE — Progress Notes (Signed)
   PRENATAL VISIT NOTE  Subjective:  Sylvia Bullock is a 22 y.o. [id number] at [redacted]w[redacted]d being seen today for ongoing prenatal care.  She is currently monitored for the following issues for this high-risk pregnancy and has Fetal death; History of preterm delivery, currently pregnant; Supervision of high-risk pregnancy; Language barrier, speaks [name] only; IUGR (intrauterine growth restriction) affecting care of mother; [redacted] weeks gestation of pregnancy; and Late prenatal care complicating pregnancy, third trimester on their problem list.  Patient reports urinary frequency, low pelvic pain, hemorrhoids.  Contractions: Not present. Vag. Bleeding: None.  Movement: Present. Denies leaking of fluid.   Has history of SGA/IUGR, has been follwowed with dopplers and growth [place] Hx Preterm birth x 3  The following portions of the patient's history were reviewed and updated as appropriate: allergies, current medications, past family history, past medical history, past social history, past surgical history and problem list. Problem list updated.  Objective:   Vitals:   11/21/17 1026  BP: 109/69  Pulse: 79  Weight: 112 lb (50.8 kg)    Fetal Status: Fetal Heart Rate (bpm): 145   Movement: Present     General:  Alert, oriented and cooperative. Patient is in no acute distress.  Skin: Skin is warm and dry. No rash noted.   Cardiovascular: Normal heart rate noted  Respiratory: Normal respiratory effort, no problems with respiration noted  Abdomen: Soft, gravid, appropriate for gestational age.  Pain/Pressure: Absent     Pelvic: Cervical exam performed        Cervix 2cm/80/soft/-3/ballot  Extremities: Normal range of motion.  Edema: None  Mental Status: Normal mood and affect. Normal behavior. Normal judgment and thought content.   Assessment and Plan:  Pregnancy: [id number] at [redacted]w[redacted]d  There are no diagnoses linked to this encounter. Preterm labor symptoms and general obstetric precautions including but not limited to  vaginal bleeding, contractions, leaking of fluid and fetal movement were reviewed in detail with the patient. Please refer to After Visit Summary for other counseling recommendations.    Future Appointments  Date Time Provider Department Center  11/25/2017 11:15 AM [facility] [place] 4 WH-MFCUS MFC-US   To [facility] for Betamethasone, EFM, possible tocolysis   Wynelle Bourgeois, CNM

## 2017-11-22 ENCOUNTER — Inpatient Hospital Stay (HOSPITAL_COMMUNITY)
Admission: AD | Admit: 2017-11-22 | Discharge: 2017-11-22 | Disposition: A | Discharge status: Home / Self Care | Payer: Medicaid Other | Source: Ambulatory Visit | Attending: Obstetrics and Gynecology | Admitting: Obstetrics and Gynecology

## 2017-11-22 DIAGNOSIS — O4690 Antepartum hemorrhage, unspecified, unspecified trimester: Secondary | ICD-10-CM | POA: Diagnosis not present

## 2017-11-22 DIAGNOSIS — O4703 False labor before 37 completed weeks of gestation, third trimester: Secondary | ICD-10-CM | POA: Insufficient documentation

## 2017-11-22 DIAGNOSIS — Z3A34 34 weeks gestation of pregnancy: Secondary | ICD-10-CM | POA: Diagnosis present

## 2017-11-22 MED ORDER — BETAMETHASONE SOD PHOS & ACET 6 (3-3) MG/ML IJ SUSP
12.0000 mg | Freq: Once | INTRAMUSCULAR | Status: AC
  Administered 2017-11-22: 12 mg via INTRAMUSCULAR
  Filled 2017-11-22: qty 2

## 2017-11-22 NOTE — MAU Note (Signed)
Here for 2nd dose of betamethasone,.  No complaints offered.

## 2017-11-23 LAB — URINE CULTURE: Organism ID, Bacteria: NO GROWTH

## 2017-11-25 ENCOUNTER — Encounter (HOSPITAL_COMMUNITY): Payer: Self-pay

## 2017-11-25 ENCOUNTER — Ambulatory Visit (HOSPITAL_COMMUNITY)
Admission: RE | Admit: 2017-11-25 | Discharge: 2017-11-25 | Disposition: A | Discharge status: Home / Self Care | Payer: Medicaid Other | Source: Ambulatory Visit | Attending: Family Medicine | Admitting: Family Medicine

## 2017-11-25 ENCOUNTER — Other Ambulatory Visit (HOSPITAL_COMMUNITY): Payer: Self-pay | Admitting: Obstetrics and Gynecology

## 2017-11-25 DIAGNOSIS — Z3A34 34 weeks gestation of pregnancy: Secondary | ICD-10-CM

## 2017-11-25 DIAGNOSIS — O09899 Supervision of other high risk pregnancies, unspecified trimester: Secondary | ICD-10-CM

## 2017-11-25 DIAGNOSIS — O0933 Supervision of pregnancy with insufficient antenatal care, third trimester: Secondary | ICD-10-CM | POA: Diagnosis not present

## 2017-11-25 DIAGNOSIS — Z364 Encounter for antenatal screening for fetal growth retardation: Secondary | ICD-10-CM | POA: Insufficient documentation

## 2017-11-25 DIAGNOSIS — O09299 Supervision of pregnancy with other poor reproductive or obstetric history, unspecified trimester: Secondary | ICD-10-CM

## 2017-11-25 DIAGNOSIS — O09213 Supervision of pregnancy with history of pre-term labor, third trimester: Secondary | ICD-10-CM | POA: Insufficient documentation

## 2017-11-25 DIAGNOSIS — O0930 Supervision of pregnancy with insufficient antenatal care, unspecified trimester: Secondary | ICD-10-CM

## 2017-11-25 DIAGNOSIS — O365930 Maternal care for other known or suspected poor fetal growth, third trimester, not applicable or unspecified: Secondary | ICD-10-CM | POA: Insufficient documentation

## 2017-11-25 DIAGNOSIS — Z362 Encounter for other antenatal screening follow-up: Secondary | ICD-10-CM

## 2017-11-25 DIAGNOSIS — O09219 Supervision of pregnancy with history of pre-term labor, unspecified trimester: Secondary | ICD-10-CM

## 2017-11-25 NOTE — ED Notes (Signed)
Language Resources interpreter with patient. 

## 2017-11-26 ENCOUNTER — Other Ambulatory Visit (HOSPITAL_COMMUNITY): Payer: Self-pay | Admitting: *Deleted

## 2017-11-28 ENCOUNTER — Ambulatory Visit (INDEPENDENT_AMBULATORY_CARE_PROVIDER_SITE_OTHER): Payer: Medicaid Other | Admitting: Obstetrics & Gynecology

## 2017-11-28 VITALS — BP 105/67 | HR 65 | Wt 111.0 lb

## 2017-11-28 DIAGNOSIS — O0933 Supervision of pregnancy with insufficient antenatal care, third trimester: Secondary | ICD-10-CM

## 2017-11-28 DIAGNOSIS — Z789 Other specified health status: Secondary | ICD-10-CM | POA: Diagnosis not present

## 2017-11-28 DIAGNOSIS — Z8751 Personal history of pre-term labor: Secondary | ICD-10-CM | POA: Diagnosis not present

## 2017-11-28 DIAGNOSIS — O365990 Maternal care for other known or suspected poor fetal growth, unspecified trimester, not applicable or unspecified: Secondary | ICD-10-CM

## 2017-11-28 DIAGNOSIS — O0990 Supervision of high risk pregnancy, unspecified, unspecified trimester: Secondary | ICD-10-CM | POA: Diagnosis not present

## 2017-11-28 NOTE — Progress Notes (Signed)
   PRENATAL VISIT NOTE  Subjective:  Sylvia Bullock is a 22 y.o. [id number] at [redacted]w[redacted]d being seen today for ongoing prenatal care.  She is currently monitored for the following issues for this high-risk pregnancy and has Fetal death; History of preterm delivery; Supervision of high-risk pregnancy; Language barrier, speaks [name] only; IUGR (intrauterine growth restriction) affecting care of mother; Late prenatal care complicating pregnancy, third trimester; Placental abruption in third trimester; and Postpartum hemorrhage on their problem list.  Patient reports no complaints.  Contractions: Not present. Vag. Bleeding: None.  Movement: Present. Denies leaking of fluid.   The following portions of the patient's history were reviewed and updated as appropriate: allergies, current medications, past family history, past medical history, past social history, past surgical history and problem list. Problem list updated.  Objective:   Vitals:   11/28/17 1009  BP: 105/67  Pulse: 65  Weight: 111 lb (50.3 kg)    Fetal Status: Fetal Heart Rate (bpm): 131   Movement: Present     General:  Alert, oriented and cooperative. Patient is in no acute distress.  Skin: Skin is warm and dry. No rash noted.   Cardiovascular: Normal heart rate noted  Respiratory: Normal respiratory effort, no problems with respiration noted  Abdomen: Soft, gravid, appropriate for gestational age.  Pain/Pressure: Absent     Pelvic: Cervical exam deferred        Extremities: Normal range of motion.  Edema: None  Mental Status: Normal mood and affect. Normal behavior. Normal judgment and thought content.   Assessment and Plan:  Pregnancy: [id number] at [redacted]w[redacted]d  1. Supervision of high risk pregnancy, antepartum Antenatal testing weekly with BPP   2. Late prenatal care complicating pregnancy, third trimester  3. Language barrier, speaks [name] only  4. Poor fetal growth affecting management of mother, antepartum, single or unspecified  fetus See below  5. History of preterm delivery  6.. IUGR 11/25/2017 Impression  SIUP at 34+4 weeks  Normal interval anatomy; anatomic survey complete  Normal amniotic fluid volume  EFW< 10th %tile; AC < 3rd %tile  UA dopplers were normal for this GA  BPP 8/8  The fetus has grown 3 weeks (29+1 to 32+0) over the past 3  weeks. ---------------------------------------------------------------------- Recommendations  Continue weekly testing  Growth in 3 weeks  Preterm labor symptoms and general obstetric precautions including but not limited to vaginal bleeding, contractions, leaking of fluid and fetal movement were reviewed in detail with the patient. Please refer to After Visit Summary for other counseling recommendations.  Return in about 2 weeks (around 12/12/2017).  Future Appointments  Date Time Provider Department Center  12/02/2017 11:15 AM [facility] [place] 4 WH-MFCUS MFC-US  12/05/2017 11:30 AM Conan Bowens, MD [facility] None  12/09/2017 11:15 AM [facility] [place] 4 WH-MFCUS MFC-US  12/16/2017 10:00 AM [facility] [place] 3 WH-MFCUS MFC-US  12/23/2017  8:45 AM [facility] [place] 2 WH-MFCUS MFC-US    Willodean Rosenthal, MD

## 2017-11-28 NOTE — Progress Notes (Signed)
[place] interpreter Fredrik CoveYo [phone]

## 2017-12-02 ENCOUNTER — Encounter (HOSPITAL_COMMUNITY): Payer: Self-pay

## 2017-12-02 ENCOUNTER — Ambulatory Visit (HOSPITAL_COMMUNITY)
Admission: RE | Admit: 2017-12-02 | Discharge: 2017-12-02 | Disposition: A | Discharge status: Home / Self Care | Payer: Medicaid Other | Source: Ambulatory Visit | Attending: Obstetrics & Gynecology | Admitting: Obstetrics & Gynecology

## 2017-12-02 ENCOUNTER — Other Ambulatory Visit (HOSPITAL_COMMUNITY): Payer: Self-pay | Admitting: Maternal and Fetal Medicine

## 2017-12-02 DIAGNOSIS — O09299 Supervision of pregnancy with other poor reproductive or obstetric history, unspecified trimester: Secondary | ICD-10-CM

## 2017-12-02 DIAGNOSIS — Z364 Encounter for antenatal screening for fetal growth retardation: Secondary | ICD-10-CM

## 2017-12-02 DIAGNOSIS — O0930 Supervision of pregnancy with insufficient antenatal care, unspecified trimester: Secondary | ICD-10-CM | POA: Insufficient documentation

## 2017-12-02 DIAGNOSIS — Z3A35 35 weeks gestation of pregnancy: Secondary | ICD-10-CM | POA: Diagnosis not present

## 2017-12-02 DIAGNOSIS — O365930 Maternal care for other known or suspected poor fetal growth, third trimester, not applicable or unspecified: Secondary | ICD-10-CM | POA: Diagnosis not present

## 2017-12-05 ENCOUNTER — Encounter: Payer: Medicaid Other | Admitting: Obstetrics and Gynecology

## 2017-12-05 ENCOUNTER — Other Ambulatory Visit (HOSPITAL_COMMUNITY): Payer: Self-pay | Admitting: Maternal and Fetal Medicine

## 2017-12-05 DIAGNOSIS — O365990 Maternal care for other known or suspected poor fetal growth, unspecified trimester, not applicable or unspecified: Secondary | ICD-10-CM

## 2017-12-05 DIAGNOSIS — Z364 Encounter for antenatal screening for fetal growth retardation: Secondary | ICD-10-CM

## 2017-12-05 DIAGNOSIS — O365930 Maternal care for other known or suspected poor fetal growth, third trimester, not applicable or unspecified: Secondary | ICD-10-CM

## 2017-12-05 DIAGNOSIS — Z3A37 37 weeks gestation of pregnancy: Secondary | ICD-10-CM

## 2017-12-05 DIAGNOSIS — Z3A39 39 weeks gestation of pregnancy: Secondary | ICD-10-CM

## 2017-12-05 DIAGNOSIS — O0933 Supervision of pregnancy with insufficient antenatal care, third trimester: Secondary | ICD-10-CM

## 2017-12-05 DIAGNOSIS — Z3A36 36 weeks gestation of pregnancy: Secondary | ICD-10-CM

## 2017-12-09 ENCOUNTER — Ambulatory Visit (HOSPITAL_COMMUNITY)
Admission: RE | Admit: 2017-12-09 | Discharge: 2017-12-09 | Disposition: A | Discharge status: Home / Self Care | Payer: Medicaid Other | Source: Ambulatory Visit | Attending: Obstetrics & Gynecology | Admitting: Obstetrics & Gynecology

## 2017-12-12 ENCOUNTER — Inpatient Hospital Stay (HOSPITAL_COMMUNITY): Payer: Medicaid Other

## 2017-12-12 ENCOUNTER — Encounter: Payer: Self-pay | Admitting: Obstetrics and Gynecology

## 2017-12-12 ENCOUNTER — Other Ambulatory Visit: Payer: Self-pay

## 2017-12-12 ENCOUNTER — Inpatient Hospital Stay (HOSPITAL_COMMUNITY)
Admission: AD | Admit: 2017-12-12 | Discharge: 2017-12-12 | Disposition: A | Discharge status: Home / Self Care | Payer: Medicaid Other | Source: Ambulatory Visit | Attending: Obstetrics and Gynecology | Admitting: Obstetrics and Gynecology

## 2017-12-12 ENCOUNTER — Encounter (HOSPITAL_COMMUNITY): Payer: Self-pay | Admitting: *Deleted

## 2017-12-12 ENCOUNTER — Ambulatory Visit (INDEPENDENT_AMBULATORY_CARE_PROVIDER_SITE_OTHER): Payer: Medicaid Other | Admitting: Obstetrics and Gynecology

## 2017-12-12 VITALS — BP 101/60 | HR 67 | Wt 111.8 lb

## 2017-12-12 DIAGNOSIS — Z8751 Personal history of pre-term labor: Secondary | ICD-10-CM

## 2017-12-12 DIAGNOSIS — O365993 Maternal care for other known or suspected poor fetal growth, unspecified trimester, fetus 3: Secondary | ICD-10-CM

## 2017-12-12 DIAGNOSIS — Z789 Other specified health status: Secondary | ICD-10-CM | POA: Diagnosis not present

## 2017-12-12 DIAGNOSIS — O0933 Supervision of pregnancy with insufficient antenatal care, third trimester: Secondary | ICD-10-CM | POA: Diagnosis not present

## 2017-12-12 DIAGNOSIS — O09213 Supervision of pregnancy with history of pre-term labor, third trimester: Secondary | ICD-10-CM | POA: Diagnosis not present

## 2017-12-12 DIAGNOSIS — O365930 Maternal care for other known or suspected poor fetal growth, third trimester, not applicable or unspecified: Secondary | ICD-10-CM

## 2017-12-12 DIAGNOSIS — O0990 Supervision of high risk pregnancy, unspecified, unspecified trimester: Secondary | ICD-10-CM

## 2017-12-12 DIAGNOSIS — Z3689 Encounter for other specified antenatal screening: Secondary | ICD-10-CM

## 2017-12-12 DIAGNOSIS — O0930 Supervision of pregnancy with insufficient antenatal care, unspecified trimester: Secondary | ICD-10-CM

## 2017-12-12 DIAGNOSIS — O0993 Supervision of high risk pregnancy, unspecified, third trimester: Secondary | ICD-10-CM

## 2017-12-12 DIAGNOSIS — Z3A37 37 weeks gestation of pregnancy: Secondary | ICD-10-CM | POA: Insufficient documentation

## 2017-12-12 DIAGNOSIS — O365990 Maternal care for other known or suspected poor fetal growth, unspecified trimester, not applicable or unspecified: Secondary | ICD-10-CM

## 2017-12-12 LAB — OB RESULTS CONSOLE GBS: STREP GROUP B AG: NEGATIVE

## 2017-12-12 NOTE — MAU Provider Note (Signed)
History     CSN: [id number]  Arrival date and time: 12/12/17 1232   First Provider Initiated Contact with Patient 12/12/17 1429      Chief Complaint  Patient presents with  . Fetal Monitoring   HPI Sylvia Bullock is a 22 y.o. [id number] at [redacted]w[redacted]d who presents from the office for a BPP. She is being monitored for IUGR and missed her last ultrasound appointment for a BPP and dopplers. She denies any pain, vaginal bleeding or leaking of fluid. Reports good fetal movement.   OB History    Gravida  4   Para  3   Term      Preterm  3   AB      Living  2     SAB      TAB      Ectopic      Multiple  0   Live Births  3        Obstetric Comments  Delivered infant at 21 weeks but died shortly after birth due to prematurity.        Past Medical History:  Diagnosis Date  . Anemia     History reviewed. No pertinent surgical history.  History reviewed. No pertinent family history.  Social History   Tobacco Use  . Smoking status: Never Smoker  . Smokeless tobacco: Never Used  Substance Use Topics  . Alcohol use: No  . Drug use: No    Allergies: No Known Allergies  Medications Prior to Admission  Medication Sig Dispense Refill Last Dose  . Prenatal Vit-DSS-Fe Fum-FA (SE-NATAL 19) 29-1 MG TABS Take 1 tablet by mouth daily before breakfast. 30 tablet 5 12/11/2017 at Unknown time  . loratadine (CLARITIN) 10 MG tablet Take 1 tablet (10 mg total) by mouth daily. (Patient not taking: Reported on 11/21/2017) 90 tablet 1 Not Taking    Review of Systems  Constitutional: Negative.  Negative for fatigue and fever.  HENT: Negative.   Respiratory: Negative.  Negative for shortness of breath.   Cardiovascular: Negative.  Negative for chest pain.  Gastrointestinal: Negative.  Negative for abdominal pain, constipation, diarrhea, nausea and vomiting.  Genitourinary: Negative.  Negative for dysuria, vaginal bleeding and vaginal discharge.  Neurological: Negative.  Negative for  dizziness and headaches.   Physical Exam   Blood pressure (!) 104/59, pulse 70, temperature 98.4 F (36.9 C), temperature source Oral, resp. rate 17, height 5\' 2"  (1.575 m), weight 111 lb 12 oz (50.7 kg), last menstrual period 03/28/2017, SpO2 98 %, unknown if currently breastfeeding.  Physical Exam  Nursing note and vitals reviewed. Constitutional: She is oriented to person, place, and time. She appears well-developed and well-nourished. No distress.  HENT:  Head: Normocephalic.  Eyes: Pupils are equal, round, and reactive to light.  Cardiovascular: Normal rate, regular rhythm and normal heart sounds.  Respiratory: Effort normal and breath sounds normal. No respiratory distress.  GI: Soft. Bowel sounds are normal. She exhibits no distension. There is no tenderness.  Neurological: She is alert and oriented to person, place, and time.  Skin: Skin is warm and dry.  Psychiatric: She has a normal mood and affect. Her behavior is normal. Judgment and thought content normal.   Fetal Tracing:  Baseline: 130 Variability: moderate Accels: 15x15 Decels: none  Toco: none   MAU Course  Procedures  MDM NST reactive  [place] BPP- 8/8 with normal AFI, normal dopplers, no absent or reverse flow  Consulted with Dr. Alysia Penna- ok to discharge patient home to  follow up in the office.   Assessment and Plan   1. NST (non-stress test) reactive   2. Small for gestational age   803. Late prenatal care   4. Previous preterm delivery in third trimester, antepartum    -Discharge home in stable condition -Fetal kick count precautions discussed -Patient advised to [facility] as scheduled for prenatal care and antenatal testing.  -Patient may return to MAU as needed or if her condition were to change or worsen  Rolm BookbinderCaroline M Lonnel Gjerde CNM 12/12/2017, 2:29 PM

## 2017-12-12 NOTE — Discharge Instructions (Signed)

## 2017-12-12 NOTE — Progress Notes (Signed)
   PRENATAL VISIT NOTE  Subjective:  Sylvia Bullock is a 22 y.o. [id number] at 6766w0d being seen today for ongoing prenatal care.  She is currently monitored for the following issues for this high-risk pregnancy and has Fetal death; History of preterm delivery; Supervision of high-risk pregnancy; Language barrier, speaks Clydie BraunKaren only; IUGR (intrauterine growth restriction) affecting care of mother; Late prenatal care complicating pregnancy, third trimester; Placental abruption in third trimester; Postpartum hemorrhage; SGA (small for gestational age); 1635 weeks gestation of pregnancy; Late prenatal care; Pregnancy with poor obstetric history; Ultrasound for antenatal screening for fetal growth restriction; and Intrauterine growth restriction, antepartum, third trimester, not applicable or unspecified fetus on their problem list.  Patient reports no complaints.  Contractions: Not present. Vag. Bleeding: None.  Movement: Present. Denies leaking of fluid.   The following portions of the patient's history were reviewed and updated as appropriate: allergies, current medications, past family history, past medical history, past social history, past surgical history and problem list. Problem list updated.  Objective:   Vitals:   12/12/17 1053  BP: 101/60  Pulse: 67  Weight: 111 lb 12.8 oz (50.7 kg)   Fetal Status: Fetal Heart Rate (bpm): 141   Movement: Present     General:  Alert, oriented and cooperative. Patient is in no acute distress.  Skin: Skin is warm and dry. No rash noted.   Cardiovascular: Normal heart rate noted  Respiratory: Normal respiratory effort, no problems with respiration noted  Abdomen: Soft, gravid, appropriate for gestational age.  Pain/Pressure: Absent     Pelvic: Cervical exam deferred        Extremities: Normal range of motion.  Edema: None  Mental Status: Normal mood and affect. Normal behavior. Normal judgment and thought content.   Assessment and Plan:  Pregnancy: A5W0981G4P0302 at  3266w0d  1. Language barrier, speaks Clydie BraunKaren only Engineer, technical salesnterpretor used  2. Supervision of high risk pregnancy, antepartum  3. History of preterm delivery  4. Intrauterine growth restriction, antepartum, third trimester, not applicable or unspecified fetus Getting weekly dopplers and BPP No show for last visit for dopplers/BPP Patient to go to MAU today for BPP  Patient's husband states he has to go to work and they don't have time to go to MAU today, I strongly recommended they attend as there is no other way to assure baby is doing well, they are agreeable.  To MAU for BPP, discussed with Dr. Alysia PennaErvin.   Term labor symptoms and general obstetric precautions including but not limited to vaginal bleeding, contractions, leaking of fluid and fetal movement were reviewed in detail with the patient. Please refer to After Visit Summary for other counseling recommendations.  Return in about 1 week (around 12/19/2017) for OB visit (MD).  Future Appointments  Date Time Provider Department Center  12/16/2017 10:00 AM WH-MFC US 3 WH-MFCUS MFC-US  12/19/2017 11:00 AM Adam PhenixArnold, James G, MD [facility] None  12/23/2017  8:45 AM WH-MFC US 2 WH-MFCUS MFC-US  12/26/2017  7:30 AM WH-BSSCHED ROOM WH-BSSCHED None  12/26/2017 10:45 AM Levie HeritageStinson, Jacob J, DO [facility] None    Conan BowensKelly M Davis, MD

## 2017-12-12 NOTE — MAU Note (Signed)
Pt sent from MD office for EFM and BPP secondary IUGR with normal dopplers.

## 2017-12-12 NOTE — Progress Notes (Signed)
All discharge instructions completed with the patient using the Interpreter I-Pad. All questions and concerns address and Patient verbalizes an understanding. All printed instructions given and explained to the patient using the Interpreter I-Pad.

## 2017-12-12 NOTE — Progress Notes (Signed)
[employer] interpreter [place] [phone]

## 2017-12-15 ENCOUNTER — Telehealth (HOSPITAL_COMMUNITY): Payer: Self-pay | Admitting: *Deleted

## 2017-12-15 NOTE — Telephone Encounter (Signed)
Preadmission screen Interpreter [id number] 

## 2017-12-16 ENCOUNTER — Ambulatory Visit (HOSPITAL_COMMUNITY)
Admission: RE | Admit: 2017-12-16 | Discharge: 2017-12-16 | Disposition: A | Discharge status: Home / Self Care | Payer: Medicaid Other | Source: Ambulatory Visit | Attending: Obstetrics & Gynecology | Admitting: Obstetrics & Gynecology

## 2017-12-16 ENCOUNTER — Encounter (HOSPITAL_COMMUNITY): Payer: Self-pay

## 2017-12-16 DIAGNOSIS — O365930 Maternal care for other known or suspected poor fetal growth, third trimester, not applicable or unspecified: Secondary | ICD-10-CM | POA: Insufficient documentation

## 2017-12-16 DIAGNOSIS — Z3A37 37 weeks gestation of pregnancy: Secondary | ICD-10-CM | POA: Diagnosis not present

## 2017-12-16 DIAGNOSIS — O0933 Supervision of pregnancy with insufficient antenatal care, third trimester: Secondary | ICD-10-CM | POA: Diagnosis present

## 2017-12-16 DIAGNOSIS — O09213 Supervision of pregnancy with history of pre-term labor, third trimester: Secondary | ICD-10-CM | POA: Insufficient documentation

## 2017-12-16 DIAGNOSIS — Z364 Encounter for antenatal screening for fetal growth retardation: Secondary | ICD-10-CM | POA: Diagnosis not present

## 2017-12-16 LAB — SPECIMEN STATUS REPORT

## 2017-12-16 LAB — CULTURE, BETA STREP (GROUP B ONLY): Strep Gp B Culture: NEGATIVE

## 2017-12-19 ENCOUNTER — Ambulatory Visit (INDEPENDENT_AMBULATORY_CARE_PROVIDER_SITE_OTHER): Payer: Medicaid Other | Admitting: Obstetrics & Gynecology

## 2017-12-19 VITALS — BP 101/48 | HR 63 | Wt 115.0 lb

## 2017-12-19 DIAGNOSIS — O365930 Maternal care for other known or suspected poor fetal growth, third trimester, not applicable or unspecified: Secondary | ICD-10-CM

## 2017-12-19 DIAGNOSIS — O0990 Supervision of high risk pregnancy, unspecified, unspecified trimester: Secondary | ICD-10-CM

## 2017-12-19 NOTE — Progress Notes (Signed)
[employer] interpreter [place] [phone]

## 2017-12-19 NOTE — Progress Notes (Signed)
   PRENATAL VISIT NOTE  Subjective:  Sylvia Bullock is a 22 y.o. [id number] at 221w0d being seen today for ongoing prenatal care.  She is currently monitored for the following issues for this high-risk pregnancy and has Fetal death; History of preterm delivery; Supervision of high-risk pregnancy; Language barrier, speaks Clydie BraunKaren only; IUGR (intrauterine growth restriction) affecting care of mother; Late prenatal care complicating pregnancy, third trimester; Placental abruption in third trimester; Postpartum hemorrhage; SGA (small for gestational age); 6735 weeks gestation of pregnancy; Late prenatal care; Pregnancy with poor obstetric history; Ultrasound for antenatal screening for fetal growth restriction; and Intrauterine growth restriction, antepartum, third trimester, not applicable or unspecified fetus on their problem list.  Patient reports no complaints.  Contractions: Not present. Vag. Bleeding: None.  Movement: Present. Denies leaking of fluid.   The following portions of the patient's history were reviewed and updated as appropriate: allergies, current medications, past family history, past medical history, past social history, past surgical history and problem list. Problem list updated.  Objective:   Vitals:   12/19/17 1052 12/19/17 1057  BP: (!) 92/57 (!) 101/48  Pulse: 67 63  Weight: 115 lb (52.2 kg)     Fetal Status: Fetal Heart Rate (bpm): 136   Movement: Present     General:  Alert, oriented and cooperative. Patient is in no acute distress.  Skin: Skin is warm and dry. No rash noted.   Cardiovascular: Normal heart rate noted  Respiratory: Normal respiratory effort, no problems with respiration noted  Abdomen: Soft, gravid, appropriate for gestational age.  Pain/Pressure: Absent     Pelvic: Cervical exam deferred        Extremities: Normal range of motion.  Edema: None  Mental Status: Normal mood and affect. Normal behavior. Normal judgment and thought content.   Assessment and Plan:    Pregnancy: [id number] at [redacted]w[redacted]d  1. Intrauterine growth restriction, antepartum, third trimester, not applicable or unspecified fetus IOL 39 weeks, f/u [place] is scheduled  2. Supervision of high risk pregnancy, antepartum   Term labor symptoms and general obstetric precautions including but not limited to vaginal bleeding, contractions, leaking of fluid and fetal movement were reviewed in detail with the patient. Please refer to After Visit Summary for other counseling recommendations.  Return if symptoms worsen or fail to improve.  Future Appointments  Date Time Provider Department Center  12/23/2017  8:45 AM WH-MFC US 2 WH-MFCUS MFC-US  12/26/2017  7:30 AM WH-BSSCHED ROOM WH-BSSCHED None  01/19/2018 10:00 AM Levie HeritageStinson, Jacob J, DO [facility] None    Scheryl DarterJames Irvin Lizama, MD

## 2017-12-19 NOTE — Patient Instructions (Signed)
Labor Induction Labor induction is when steps are taken to cause a pregnant woman to begin the labor process. Most women go into labor on their own between 37 weeks and 42 weeks of the pregnancy. When this does not happen or when there is a medical need, methods may be used to induce labor. Labor induction causes a pregnant woman's uterus to contract. It also causes the cervix to soften (ripen), open (dilate), and thin out (efface). Usually, labor is not induced before 39 weeks of the pregnancy unless there is a problem with the baby or mother. Before inducing labor, your health care provider will consider a number of factors, including the following:  The medical condition of you and the baby.  How many weeks along you are.  The status of the baby's lung maturity.  The condition of the cervix.  The position of the baby. What are the reasons for labor induction? Labor may be induced for the following reasons:  The health of the baby or mother is at risk.  The pregnancy is overdue by 1 week or more.  The water breaks but labor does not start on its own.  The mother has a health condition or serious illness, such as high blood pressure, infection, placental abruption, or diabetes.  The amniotic fluid amounts are low around the baby.  The baby is distressed. Convenience or wanting the baby to be born on a certain date is not a reason for inducing labor. What methods are used for labor induction? Several methods of labor induction may be used, such as:  Prostaglandin medicine. This medicine causes the cervix to dilate and ripen. The medicine will also start contractions. It can be taken by mouth or by inserting a suppository into the vagina.  Inserting a thin tube (catheter) with a balloon on the end into the vagina to dilate the cervix. Once inserted, the balloon is expanded with water, which causes the cervix to open.  Stripping the membranes. Your health care provider separates  amniotic sac tissue from the cervix, causing the cervix to be stretched and causing the release of a hormone called progesterone. This may cause the uterus to contract. It is often done during an office visit. You will be sent home to wait for the contractions to begin. You will then come in for an induction.  Breaking the water. Your health care provider makes a hole in the amniotic sac using a small instrument. Once the amniotic sac breaks, contractions should begin. This may still take hours to see an effect.  Medicine to trigger or strengthen contractions. This medicine is given through an IV access tube inserted into a vein in your arm. All of the methods of induction, besides stripping the membranes, will be done in the hospital. Induction is done in the hospital so that you and the baby can be carefully monitored. How long does it take for labor to be induced? Some inductions can take up to 2-3 days. Depending on the cervix, it usually takes less time. It takes longer when you are induced early in the pregnancy or if this is your first pregnancy. If a mother is still pregnant and the induction has been going on for 2-3 days, either the mother will be sent home or a cesarean delivery will be needed. What are the risks associated with labor induction? Some of the risks of induction include:  Changes in fetal heart rate, such as too high, too low, or erratic.  Fetal distress.    Chance of infection for the mother and baby.  Increased chance of having a cesarean delivery.  Breaking off (abruption) of the placenta from the uterus (rare).  Uterine rupture (very rare). When induction is needed for medical reasons, the benefits of induction may outweigh the risks. What are some reasons for not inducing labor? Labor induction should not be done if:  It is shown that your baby does not tolerate labor.  You have had previous surgeries on your uterus, such as a myomectomy or the removal of  fibroids.  Your placenta lies very low in the uterus and blocks the opening of the cervix (placenta previa).  Your baby is not in a head-down position.  The umbilical cord drops down into the birth canal in front of the baby. This could cut off the baby's blood and oxygen supply.  You have had a previous cesarean delivery.  There are unusual circumstances, such as the baby being extremely premature. This information is not intended to replace advice given to you by your health care provider. Make sure you discuss any questions you have with your health care provider. Document Released: 11/06/2006 Document Revised: 11/23/2015 Document Reviewed: 01/14/2013 Elsevier Interactive Patient Education  2017 Elsevier Inc.  

## 2017-12-23 ENCOUNTER — Encounter (HOSPITAL_COMMUNITY): Payer: Self-pay

## 2017-12-23 ENCOUNTER — Ambulatory Visit (HOSPITAL_COMMUNITY)
Admission: RE | Admit: 2017-12-23 | Discharge: 2017-12-23 | Disposition: A | Discharge status: Home / Self Care | Payer: Medicaid Other | Source: Ambulatory Visit | Attending: Obstetrics & Gynecology | Admitting: Obstetrics & Gynecology

## 2017-12-23 DIAGNOSIS — Z3A39 39 weeks gestation of pregnancy: Secondary | ICD-10-CM | POA: Diagnosis not present

## 2017-12-23 DIAGNOSIS — O365930 Maternal care for other known or suspected poor fetal growth, third trimester, not applicable or unspecified: Secondary | ICD-10-CM | POA: Diagnosis present

## 2017-12-23 DIAGNOSIS — O0933 Supervision of pregnancy with insufficient antenatal care, third trimester: Secondary | ICD-10-CM | POA: Insufficient documentation

## 2017-12-23 DIAGNOSIS — Z364 Encounter for antenatal screening for fetal growth retardation: Secondary | ICD-10-CM | POA: Insufficient documentation

## 2017-12-26 ENCOUNTER — Encounter (HOSPITAL_COMMUNITY): Payer: Self-pay

## 2017-12-26 ENCOUNTER — Encounter: Payer: Medicaid Other | Admitting: Family Medicine

## 2017-12-26 ENCOUNTER — Other Ambulatory Visit: Payer: Self-pay

## 2017-12-26 ENCOUNTER — Inpatient Hospital Stay (HOSPITAL_COMMUNITY)
Admission: RE | Admit: 2017-12-26 | Discharge: 2017-12-28 | DRG: 806 | Disposition: A | Discharge status: Home / Self Care | Payer: Medicaid Other | Attending: Family Medicine | Admitting: Family Medicine

## 2017-12-26 ENCOUNTER — Inpatient Hospital Stay (HOSPITAL_COMMUNITY): Payer: Medicaid Other | Admitting: Anesthesiology

## 2017-12-26 DIAGNOSIS — D649 Anemia, unspecified: Secondary | ICD-10-CM | POA: Diagnosis present

## 2017-12-26 DIAGNOSIS — Z3A39 39 weeks gestation of pregnancy: Secondary | ICD-10-CM

## 2017-12-26 DIAGNOSIS — O9902 Anemia complicating childbirth: Secondary | ICD-10-CM | POA: Diagnosis present

## 2017-12-26 DIAGNOSIS — O365990 Maternal care for other known or suspected poor fetal growth, unspecified trimester, not applicable or unspecified: Secondary | ICD-10-CM | POA: Diagnosis present

## 2017-12-26 DIAGNOSIS — O365930 Maternal care for other known or suspected poor fetal growth, third trimester, not applicable or unspecified: Secondary | ICD-10-CM | POA: Diagnosis present

## 2017-12-26 LAB — CBC
HCT: 34.7 % — ABNORMAL LOW (ref 36.0–46.0)
Hemoglobin: 11.2 g/dL — ABNORMAL LOW (ref 12.0–15.0)
MCH: 26 pg (ref 26.0–34.0)
MCHC: 32.3 g/dL (ref 30.0–36.0)
MCV: 80.5 fL (ref 78.0–100.0)
PLATELETS: 251 10*3/uL (ref 150–400)
RBC: 4.31 MIL/uL (ref 3.87–5.11)
RDW: 17 % — AB (ref 11.5–15.5)
WBC: 7.1 10*3/uL (ref 4.0–10.5)

## 2017-12-26 LAB — TYPE AND SCREEN
ABO/RH(D): B POS
Antibody Screen: NEGATIVE

## 2017-12-26 MED ORDER — PHENYLEPHRINE 40 MCG/ML (10ML) SYRINGE FOR IV PUSH (FOR BLOOD PRESSURE SUPPORT)
80.0000 ug | PREFILLED_SYRINGE | INTRAVENOUS | Status: DC | PRN
  Filled 2017-12-26: qty 5

## 2017-12-26 MED ORDER — ONDANSETRON HCL 4 MG/2ML IJ SOLN
4.0000 mg | Freq: Four times a day (QID) | INTRAMUSCULAR | Status: DC | PRN
  Filled 2017-12-26: qty 2

## 2017-12-26 MED ORDER — FENTANYL CITRATE (PF) 100 MCG/2ML IJ SOLN
100.0000 ug | INTRAMUSCULAR | Status: DC | PRN
  Administered 2017-12-26 – 2017-12-27 (×3): 100 ug via INTRAVENOUS
  Filled 2017-12-26 (×3): qty 2

## 2017-12-26 MED ORDER — EPHEDRINE 5 MG/ML INJ: 10.0000 mg | INTRAVENOUS | Status: DC | PRN

## 2017-12-26 MED ORDER — TERBUTALINE SULFATE 1 MG/ML IJ SOLN
0.2500 mg | Freq: Once | INTRAMUSCULAR | Status: DC | PRN
  Filled 2017-12-26: qty 1

## 2017-12-26 MED ORDER — OXYCODONE-ACETAMINOPHEN 5-325 MG PO TABS: 1.0000 | ORAL_TABLET | ORAL | Status: DC | PRN

## 2017-12-26 MED ORDER — DIPHENHYDRAMINE HCL 50 MG/ML IJ SOLN: 12.5000 mg | INTRAMUSCULAR | Status: DC | PRN

## 2017-12-26 MED ORDER — PHENYLEPHRINE 40 MCG/ML (10ML) SYRINGE FOR IV PUSH (FOR BLOOD PRESSURE SUPPORT)
80.0000 ug | PREFILLED_SYRINGE | INTRAVENOUS | Status: DC | PRN
  Filled 2017-12-26: qty 5
  Filled 2017-12-26: qty 10

## 2017-12-26 MED ORDER — MISOPROSTOL 200 MCG PO TABS
ORAL_TABLET | ORAL | Status: AC
  Administered 2017-12-26: 800 ug
  Filled 2017-12-26: qty 5

## 2017-12-26 MED ORDER — EPHEDRINE 5 MG/ML INJ
10.0000 mg | INTRAVENOUS | Status: DC | PRN
  Filled 2017-12-26: qty 2

## 2017-12-26 MED ORDER — OXYTOCIN BOLUS FROM INFUSION: 500.0000 mL | Freq: Once | INTRAVENOUS | Status: DC

## 2017-12-26 MED ORDER — MISOPROSTOL 200 MCG PO TABS
ORAL_TABLET | ORAL | Status: AC
  Filled 2017-12-26: qty 1

## 2017-12-26 MED ORDER — METHYLERGONOVINE MALEATE 0.2 MG/ML IJ SOLN
INTRAMUSCULAR | Status: AC
  Administered 2017-12-26: 0.2 mg
  Filled 2017-12-26: qty 1

## 2017-12-26 MED ORDER — OXYTOCIN 40 UNITS IN LACTATED RINGERS INFUSION - SIMPLE MED
1.0000 m[IU]/min | INTRAVENOUS | Status: DC
  Administered 2017-12-26: 2 m[IU]/min via INTRAVENOUS

## 2017-12-26 MED ORDER — PHENYLEPHRINE 40 MCG/ML (10ML) SYRINGE FOR IV PUSH (FOR BLOOD PRESSURE SUPPORT): 80.0000 ug | PREFILLED_SYRINGE | INTRAVENOUS | Status: DC | PRN

## 2017-12-26 MED ORDER — MISOPROSTOL 25 MCG QUARTER TABLET
25.0000 ug | ORAL_TABLET | ORAL | Status: DC | PRN
  Filled 2017-12-26: qty 1

## 2017-12-26 MED ORDER — ACETAMINOPHEN 325 MG PO TABS
650.0000 mg | ORAL_TABLET | ORAL | Status: DC | PRN
  Administered 2017-12-27: 650 mg via ORAL
  Filled 2017-12-26: qty 2

## 2017-12-26 MED ORDER — LACTATED RINGERS IV SOLN: 500.0000 mL | INTRAVENOUS | Status: DC | PRN

## 2017-12-26 MED ORDER — LIDOCAINE HCL (PF) 1 % IJ SOLN
INTRAMUSCULAR | Status: DC | PRN
  Administered 2017-12-26: 5 mL via EPIDURAL

## 2017-12-26 MED ORDER — FENTANYL 2.5 MCG/ML BUPIVACAINE 1/10 % EPIDURAL INFUSION (WH - ANES)
14.0000 mL/h | INTRAMUSCULAR | Status: DC | PRN
  Administered 2017-12-26: 14 mL/h via EPIDURAL
  Filled 2017-12-26: qty 100

## 2017-12-26 MED ORDER — FLEET ENEMA 7-19 GM/118ML RE ENEM: 1.0000 | ENEMA | RECTAL | Status: DC | PRN

## 2017-12-26 MED ORDER — LACTATED RINGERS IV SOLN: 500.0000 mL | Freq: Once | INTRAVENOUS | Status: DC

## 2017-12-26 MED ORDER — OXYTOCIN 40 UNITS IN LACTATED RINGERS INFUSION - SIMPLE MED
2.5000 [IU]/h | INTRAVENOUS | Status: DC
  Filled 2017-12-26: qty 1000

## 2017-12-26 MED ORDER — OXYCODONE-ACETAMINOPHEN 5-325 MG PO TABS: 2.0000 | ORAL_TABLET | ORAL | Status: DC | PRN

## 2017-12-26 MED ORDER — LIDOCAINE HCL (PF) 1 % IJ SOLN
30.0000 mL | INTRAMUSCULAR | Status: DC | PRN
  Administered 2017-12-27: 30 mL via SUBCUTANEOUS
  Filled 2017-12-26 (×2): qty 30

## 2017-12-26 MED ORDER — LACTATED RINGERS IV SOLN
INTRAVENOUS | Status: DC
  Administered 2017-12-27: 02:00:00 via INTRAVENOUS

## 2017-12-26 MED ORDER — LACTATED RINGERS IV SOLN
500.0000 mL | Freq: Once | INTRAVENOUS | Status: AC
  Administered 2017-12-26: 500 mL via INTRAVENOUS

## 2017-12-26 MED ORDER — SOD CITRATE-CITRIC ACID 500-334 MG/5ML PO SOLN: 30.0000 mL | ORAL | Status: DC | PRN

## 2017-12-26 NOTE — MAU Note (Signed)
Pt presents for IOL d/t IUGR.  Denies LOF or VB.  Reports +FM.

## 2017-12-26 NOTE — Anesthesia Pain Management Evaluation Note (Signed)
  CRNA Pain Management Visit Note  Patient: Sylvia Bullock, 22 y.o., female  "Hello I am a [facility] the [facility]. We have an anesthesia team available at all times to provide care throughout the hospital, including epidural management and anesthesia for C-section. I don't know your plan for the delivery whether it a natural birth, water birth, IV sedation, nitrous supplementation, doula or epidural, but we want to meet your pain goals."   1.Was your pain managed to your expectations on prior hospitalizations?   No prior hospitalizations  2.What is your expectation for pain management during this hospitalization?     IV pain meds  3.How can we help you reach that goal?   Record the patient's initial score and the patient's pain goal.   Pain: 0  Pain Goal: 6 [facility] wants you to be able to say your pain was always managed very well.  Laban EmperorMalinova,Sylvia Bullock 12/26/2017

## 2017-12-26 NOTE — H&P (Addendum)
LABOR ADMISSION HISTORY AND PHYSICAL  Sylvia Bullock is a 22 y.o. female [phone] with IUP at 10187w0d by LMP presenting for IOL for IUGR. She reports +FMs, No LOF, no VB, no blurry vision, headaches or peripheral edema, and RUQ pain.  She plans on bottle feeding. She request nexplanon for birth control.  Dating: By LMP --->  Estimated Date of Delivery: 01/02/18  [facility] Active Problem List   Diagnosis Date Noted  . SGA (small for gestational age)   . [redacted] weeks gestation of pregnancy   . Late prenatal care   . Pregnancy with poor obstetric history   . Ultrasound for antenatal screening for fetal growth restriction   . Intrauterine growth restriction, antepartum, third trimester, not applicable or unspecified fetus   . Late prenatal care complicating pregnancy, third trimester   . IUGR (intrauterine growth restriction) affecting care of mother 11/10/2017  . Postpartum hemorrhage 06/20/2016  . Placental abruption in third trimester 06/04/2016  . Language barrier, speaks Clydie BraunKaren only 07/20/2015  . History of preterm delivery 04/07/2015  . Supervision of high-risk pregnancy 04/07/2015  . Fetal death 02/28/2015   Past Medical History: Past Medical History:  Diagnosis Date  . Anemia     Past Surgical History: Past Surgical History:  Procedure Laterality Date  . NO PAST SURGERIES      Obstetrical History: OB History    Gravida  4   Para  3   Term      Preterm  3   AB      Living  2     SAB      TAB      Ectopic      Multiple  0   Live Births  3        Obstetric Comments  Delivered infant at 21 weeks but died shortly after birth due to prematurity.        Social History: Social History   Socioeconomic History  . Marital status: Married    Spouse name: Not on file  . Number of children: Not on file  . Years of education: Not on file  . Highest education level: Not on file  Occupational History  . Not  on file  Social Needs  . Financial resource strain: Not on file  . Food insecurity:    Worry: Not on file    Inability: Not on file  . Transportation needs:    Medical: Not on file    Non-medical: Not on file  Tobacco Use  . Smoking status: Never Smoker  . Smokeless tobacco: Never Used  Substance and Sexual Activity  . Alcohol use: No  . Drug use: No  . Sexual activity: Yes  Lifestyle  . Physical activity:    Days per week: Not on file    Minutes per session: Not on file  . Stress: Not on file  Relationships  . Social connections:    Talks on phone: Not on file    Gets together: Not on file    Attends religious service: Not on file    Active member of club or organization: Not on file    Attends meetings of clubs or organizations: Not on file    Relationship status: Not on file  Other Topics Concern  . Not on file  Social History Narrative  . Not on file    Family History: History reviewed. No pertinent family history.  Allergies: No Known Allergies  Medications Prior to Admission  Medication Sig Dispense Refill Last Dose  . Prenatal Vit-DSS-Fe Fum-FA (SE-NATAL 19) 29-1 MG TABS Take 1 tablet by mouth daily before breakfast. 30 tablet 5 12/25/2017 at Unknown time  . loratadine (CLARITIN) 10 MG tablet Take 1 tablet (10 mg total) by mouth daily. (Patient not taking: Reported on 11/21/2017) 90 tablet 1 Not Taking    Review of Systems   All systems reviewed and negative except as stated in HPI  Physical Exam Blood pressure 108/68, pulse (!) 57, temperature 97.9 F (36.6 C), temperature source Oral, resp. rate 18, last menstrual period 03/28/2017, SpO2 98 %, unknown if currently breastfeeding. General appearance: alert, cooperative and tearful HEENT: NO JVD, MMM Abdomen: soft, non-tender; bowel sounds normal Extremities: Homans sign is negative, no sign of DVT Presentation: unsure Fetal monitoringBaseline: 125 bpm, Variability: Good {> 6 bpm), Accelerations:  Reactive and Decelerations: Absent Uterine activityFrequency: occassional    Prenatal labs: ABO, Rh: B/Positive/-- (05/02 1010) Antibody: Negative (05/02 1010) Rubella: 22.80 (05/02 1010) RPR: Non Reactive (05/02 1010)  HBsAg: Negative (05/02 1010)  HIV: Non Reactive (05/02 1010)  GBS:   Negative 1 hr Glucola wnl  Prenatal Transfer Tool  Maternal Diabetes: No Genetic Screening: Declined Maternal Ultrasounds/Referrals: Abnormal:  Findings:   IUGR Fetal Ultrasounds or other Referrals:  Referred to Materal Fetal Medicine  Maternal Substance Abuse:  No Significant Maternal Medications:  None Significant Maternal Lab Results: None  No results found for this or any previous visit (from the past 24 hour(s)).  Patient Active Problem List   Diagnosis Date Noted  . SGA (small for gestational age)   . [redacted] weeks gestation of pregnancy   . Late prenatal care   . Pregnancy with poor obstetric history   . Ultrasound for antenatal screening for fetal growth restriction   . Intrauterine growth restriction, antepartum, third trimester, not applicable or unspecified fetus   . Late prenatal care complicating pregnancy, third trimester   . IUGR (intrauterine growth restriction) affecting care of mother 11/10/2017  . Postpartum hemorrhage 06/20/2016  . Placental abruption in third trimester 06/04/2016  . Language barrier, speaks [name] only 07/20/2015  . History of preterm delivery 2015-04-30  . Supervision of high-risk pregnancy 30-Apr-2015  . Fetal death March 23, 2015    Assessment: Sylvia Bullock is a 22 y.o. [id number] at [redacted]w[redacted]d here for IOL for IUGR  #Labor:cervical ripening, with cytotec and FB #Pain: Considering epidural #FWB: Cat I #ID:  GBS neg #MOF: bottle #MOC:Nexplanon #Circ:  No  Thomes Dinning, MD, MS FAMILY MEDICINE RESIDENT - PGY1 12/26/2017 9:25 AM    I confirm that I have verified the information documented in the resident's note and that I have also personally reperformed the  physical exam and all medical decision making activities.   Thressa Sheller 11:51 AM 12/26/17

## 2017-12-26 NOTE — Anesthesia Preprocedure Evaluation (Signed)
Anesthesia Evaluation  Patient identified by MRN, date of birth, ID band Patient awake    Reviewed: Allergy & Precautions, NPO status , Patient's Chart, lab work & pertinent test results  Airway Mallampati: II  TM Distance: >3 FB Neck ROM: Full    Dental no notable dental hx. (+) Teeth Intact   Pulmonary neg pulmonary ROS,    Pulmonary exam normal breath sounds clear to auscultation       Cardiovascular negative cardio ROS Normal cardiovascular exam Rhythm:Regular Rate:Normal     Neuro/Psych negative neurological ROS  negative psych ROS   GI/Hepatic   Endo/Other    Renal/GU      Musculoskeletal   Abdominal   Peds  Hematology  (+) anemia ,   Anesthesia Other Findings   Reproductive/Obstetrics (+) Pregnancy                             Lab Results  Component Value Date   WBC 7.1 12/26/2017   HGB 11.2 (L) 12/26/2017   HCT 34.7 (L) 12/26/2017   MCV 80.5 12/26/2017   PLT 251 12/26/2017     Anesthesia Physical Anesthesia Plan  ASA: II  Anesthesia Plan: Epidural   Post-op Pain Management:    Induction:   PONV Risk Score and Plan:   Airway Management Planned:   Additional Equipment:   Intra-op Plan:   Post-operative Plan:   Informed Consent: I have reviewed the patients History and Physical, chart, labs and discussed the procedure including the risks, benefits and alternatives for the proposed anesthesia with the patient or authorized representative who has indicated his/her understanding and acceptance.     Plan Discussed with:   Anesthesia Plan Comments:         Anesthesia Quick Evaluation

## 2017-12-26 NOTE — Anesthesia Procedure Notes (Addendum)
Epidural Patient location during procedure: OB Start time: 12/26/2017 10:33 PM End time: 12/26/2017 10:46 PM  Staffing Anesthesiologist: Trevor IhaHouser, Stephen A, MD Performed: anesthesiologist   Preanesthetic Checklist Completed: patient identified, site marked, surgical consent, pre-op evaluation, timeout performed, IV checked, risks and benefits discussed and monitors and equipment checked  Epidural Patient position: sitting Prep: site prepped and draped and DuraPrep Patient monitoring: continuous pulse ox and blood pressure Approach: midline Location: L3-L4 Injection technique: LOR air  Needle:  Needle type: Tuohy  Needle gauge: 17 G Needle length: 9 cm and 9 Needle insertion depth: 4 cm Catheter type: closed end flexible Catheter size: 19 Gauge Catheter at skin depth: 9 cm Test dose: negative  Assessment Events: blood not aspirated, injection not painful, no injection resistance, negative IV test and no paresthesia

## 2017-12-27 ENCOUNTER — Encounter (HOSPITAL_COMMUNITY): Payer: Self-pay

## 2017-12-27 LAB — CBC
HCT: 26.5 % — ABNORMAL LOW (ref 36.0–46.0)
HEMATOCRIT: 34.1 % — AB (ref 36.0–46.0)
Hemoglobin: 10.9 g/dL — ABNORMAL LOW (ref 12.0–15.0)
Hemoglobin: 8.9 g/dL — ABNORMAL LOW (ref 12.0–15.0)
MCH: 26.4 pg (ref 26.0–34.0)
MCH: 26.7 pg (ref 26.0–34.0)
MCHC: 32 g/dL (ref 30.0–36.0)
MCHC: 33.6 g/dL (ref 30.0–36.0)
MCV: 79.6 fL (ref 78.0–100.0)
MCV: 82.6 fL (ref 78.0–100.0)
PLATELETS: 202 10*3/uL (ref 150–400)
PLATELETS: 274 10*3/uL (ref 150–400)
RBC: 3.33 MIL/uL — AB (ref 3.87–5.11)
RBC: 4.13 MIL/uL (ref 3.87–5.11)
RDW: 16.8 % — ABNORMAL HIGH (ref 11.5–15.5)
RDW: 17.1 % — AB (ref 11.5–15.5)
WBC: 14.2 10*3/uL — ABNORMAL HIGH (ref 4.0–10.5)
WBC: 15.3 10*3/uL — ABNORMAL HIGH (ref 4.0–10.5)

## 2017-12-27 LAB — RPR: RPR Ser Ql: NONREACTIVE

## 2017-12-27 MED ORDER — WITCH HAZEL-GLYCERIN EX PADS: 1.0000 "application " | MEDICATED_PAD | CUTANEOUS | Status: DC | PRN

## 2017-12-27 MED ORDER — COCONUT OIL OIL: 1.0000 "application " | TOPICAL_OIL | Status: DC | PRN

## 2017-12-27 MED ORDER — ZOLPIDEM TARTRATE 5 MG PO TABS: 5.0000 mg | ORAL_TABLET | Freq: Every evening | ORAL | Status: DC | PRN

## 2017-12-27 MED ORDER — FERROUS SULFATE 300 (60 FE) MG/5ML PO SYRP
300.0000 mg | ORAL_SOLUTION | Freq: Two times a day (BID) | ORAL | Status: DC
  Administered 2017-12-27 – 2017-12-28 (×2): 300 mg via ORAL
  Filled 2017-12-27 (×2): qty 5

## 2017-12-27 MED ORDER — BENZOCAINE-MENTHOL 20-0.5 % EX AERO
1.0000 "application " | INHALATION_SPRAY | CUTANEOUS | Status: DC | PRN
  Administered 2017-12-27: 1 via TOPICAL
  Filled 2017-12-27 (×2): qty 56

## 2017-12-27 MED ORDER — FERROUS SULFATE 325 (65 FE) MG PO TABS
325.0000 mg | ORAL_TABLET | Freq: Two times a day (BID) | ORAL | Status: DC
  Filled 2017-12-27: qty 1

## 2017-12-27 MED ORDER — DIPHENHYDRAMINE HCL 25 MG PO CAPS: 25.0000 mg | ORAL_CAPSULE | Freq: Four times a day (QID) | ORAL | Status: DC | PRN

## 2017-12-27 MED ORDER — IBUPROFEN 600 MG PO TABS
600.0000 mg | ORAL_TABLET | Freq: Four times a day (QID) | ORAL | Status: DC
  Administered 2017-12-27: 600 mg via ORAL
  Filled 2017-12-27: qty 1

## 2017-12-27 MED ORDER — CEFAZOLIN SODIUM-DEXTROSE 2-4 GM/100ML-% IV SOLN
2.0000 g | Freq: Once | INTRAVENOUS | Status: AC
  Administered 2017-12-27: 2 g via INTRAVENOUS
  Filled 2017-12-27: qty 100

## 2017-12-27 MED ORDER — ONDANSETRON HCL 4 MG/2ML IJ SOLN: 4.0000 mg | INTRAMUSCULAR | Status: DC | PRN

## 2017-12-27 MED ORDER — ACETAMINOPHEN 325 MG PO TABS: 650.0000 mg | ORAL_TABLET | ORAL | Status: DC | PRN

## 2017-12-27 MED ORDER — ONDANSETRON HCL 4 MG PO TABS: 4.0000 mg | ORAL_TABLET | ORAL | Status: DC | PRN

## 2017-12-27 MED ORDER — LACTATED RINGERS IV BOLUS
1000.0000 mL | Freq: Once | INTRAVENOUS | Status: AC
  Administered 2017-12-27: 1000 mL via INTRAVENOUS

## 2017-12-27 MED ORDER — SENNOSIDES-DOCUSATE SODIUM 8.6-50 MG PO TABS: 2.0000 | ORAL_TABLET | ORAL | Status: DC

## 2017-12-27 MED ORDER — SIMETHICONE 80 MG PO CHEW: 80.0000 mg | CHEWABLE_TABLET | ORAL | Status: DC | PRN

## 2017-12-27 MED ORDER — TETANUS-DIPHTH-ACELL PERTUSSIS 5-2.5-18.5 LF-MCG/0.5 IM SUSP: 0.5000 mL | Freq: Once | INTRAMUSCULAR | Status: DC

## 2017-12-27 MED ORDER — PRENATAL MULTIVITAMIN CH
1.0000 | ORAL_TABLET | Freq: Every day | ORAL | Status: DC
  Administered 2017-12-27: 1 via ORAL
  Filled 2017-12-27: qty 1

## 2017-12-27 MED ORDER — DIBUCAINE 1 % RE OINT: 1.0000 "application " | TOPICAL_OINTMENT | RECTAL | Status: DC | PRN

## 2017-12-27 MED ORDER — TRANEXAMIC ACID 1000 MG/10ML IV SOLN
1000.0000 mg | Freq: Once | INTRAVENOUS | Status: AC
  Administered 2017-12-27: 1000 mg via INTRAVENOUS
  Filled 2017-12-27: qty 1100

## 2017-12-27 MED ORDER — IBUPROFEN 100 MG/5ML PO SUSP
600.0000 mg | Freq: Four times a day (QID) | ORAL | Status: DC
Start: 2017-12-27 — End: 2017-12-28
  Administered 2017-12-27 – 2017-12-28 (×3): 600 mg via ORAL
  Filled 2017-12-27 (×4): qty 30

## 2017-12-27 NOTE — Progress Notes (Signed)
Post Partum Day 1 Subjective: no complaints, up ad lib, voiding and tolerating PO  Objective: Blood pressure (!) 106/54, pulse 66, temperature 99.6 F (37.6 C), temperature source Oral, resp. rate 18, last menstrual period 03/28/2017, SpO2 98 %, unknown if currently breastfeeding.  Physical Exam:  General: cooperative, fatigued and no distress Lochia: appropriate Uterine Fundus: firm Incision: N/A DVT Evaluation: No evidence of DVT seen on physical exam.  Recent Labs    12/27/17 0004 12/27/17 0517  HGB 10.9* 8.9*  HCT 34.1* 26.5*    Assessment/Plan: Plan for discharge tomorrow  PPH - stable. Hbg 11.1>8.9. Start PO iron    LOS: 1 day   Caryl AdaJazma Phelps, DO 12/27/2017, 9:46 AM

## 2017-12-27 NOTE — Anesthesia Postprocedure Evaluation (Signed)
Anesthesia Post Note  Patient: Jamica Hinote  Procedure(s) Performed: AN AD HOC LABOR EPIDURAL     Patient location during evaluation: Mother Baby Anesthesia Type: Epidural Level of consciousness: awake and alert Pain management: pain level controlled Vital Signs Assessment: post-procedure vital signs reviewed and stable Respiratory status: spontaneous breathing, nonlabored ventilation and respiratory function stable Cardiovascular status: stable Postop Assessment: no headache, no backache, epidural receding and no apparent nausea or vomiting Anesthetic complications: no    Last Vitals:  Vitals:   12/27/17 0405 12/27/17 0528  BP: (!) 101/49 (!) 102/53  Pulse: 63 64  Resp: 16 19  Temp: 37.9 C 37.7 C  SpO2:  99%    Last Pain:  Vitals:   12/27/17 0600  TempSrc:   PainSc: Asleep   Pain Goal:                 Rica RecordsICKELTON,Jayshaun Phillips

## 2017-12-27 NOTE — Progress Notes (Signed)
All postpartum admit education provided through in-person interpreter Lay Sah.

## 2017-12-28 MED ORDER — FERROUS SULFATE 300 (60 FE) MG/5ML PO SYRP: 300.0000 mg | ORAL_SOLUTION | Freq: Two times a day (BID) | ORAL | 3 refills | Status: DC

## 2017-12-28 MED ORDER — IBUPROFEN 100 MG/5ML PO SUSP: 600.0000 mg | Freq: Four times a day (QID) | ORAL | 1 refills | Status: DC

## 2017-12-28 NOTE — Discharge Summary (Signed)
        OB Discharge Summary  Patient Name: Sylvia Bullock DOB: 10/15/1995 MRN: [id number]  Date of admission: 12/26/2017 Delivering MD: Pincus LargePHELPS, JAZMA Y   Date of discharge: 12/28/2017  Admitting diagnosis: INDUCTION Intrauterine pregnancy: 5412w0d     Secondary diagnosis:Active Problems:   IUGR (intrauterine growth restriction) affecting care of mother   SVD (spontaneous vaginal delivery)   PPH (postpartum hemorrhage)  Additional problems:none     Discharge diagnosis: Term Pregnancy Delivered                                                                     Post partum procedures:none  Augmentation: Pitocin and Cytotec  Complications: None  Hospital course:  Induction of Labor With Vaginal Delivery   22 y.o. yo 223 [id number] at 5112w0d was admitted to the hospital 12/26/2017 for induction of labor.  Indication for induction: IUGR.  Patient had an uncomplicated labor course as follows: Membrane Rupture Time/Date: 11:02 PM ,12/27/2017   Intrapartum Procedures: Episiotomy: None [1]                                         Lacerations:  1st degree [2]  Patient had delivery of a Viable infant.  Information for the patient's newborn:  Bullock, Boy Sylvia [[id number]][[id number]]  Delivery Method: Vaginal, Spontaneous(Filed from Delivery Summary)   12/26/2017  Details of delivery can be found in separate delivery note.  Patient had a routine postpartum course. Patient is discharged home 12/28/17.  Physical exam  Vitals:   12/27/17 1635 12/27/17 2300 12/27/17 2325 12/28/17 0527  BP: (!) 94/48 (!) 93/53 (!) 94/46 (!) 93/55  Pulse: 61 66 (!) 58 (!) 54  Resp: 16 16  16   Temp: 98.4 F (36.9 C) 97.8 F (36.6 C) 98.3 F (36.8 C) 98.2 F (36.8 C)  TempSrc: Oral Oral Oral   SpO2: 99% 97%  98%   General: alert, cooperative and no distress Lochia: appropriate Uterine Fundus: firm Incision: N/A DVT Evaluation: No evidence of DVT seen on physical exam. Labs: Lab Results  Component Value Date   WBC 14.2 (H)  12/27/2017   HGB 8.9 (L) 12/27/2017   HCT 26.5 (L) 12/27/2017   MCV 79.6 12/27/2017   PLT 202 12/27/2017   No flowsheet data found.  Discharge instruction: per After Visit Summary and "Baby and Me Booklet".  After Visit Meds:    Diet: routine diet  Activity: Advance as tolerated. Pelvic rest for 6 weeks.   Outpatient follow up:6 weeks Follow up Appt: Future Appointments  Date Time Provider Department Center  01/19/2018 10:00 AM Levie HeritageStinson, Jacob J, DO [facility] None   Follow up visit: No follow-ups on file.  Postpartum contraception: Nexplanon  Newborn Data: Live born female  Birth Weight: 5 lb 12.2 oz (2614 g) APGAR: 8, 9  Newborn Delivery   Birth date/time:  12/26/2017 23:08:00 Delivery type:  Vaginal, Spontaneous     Baby Feeding: Bottle and Breast Disposition:home with mother   12/28/2017 Sylvia Bullock, CNM

## 2017-12-28 NOTE — Progress Notes (Signed)
CSW received consult for MOB due to limited prenatal care, [facility] ~30 weeks. MOB was not given a UDS upon admission and newborn UDS negative at birth. CSW will not meet with MOB and newborn due to high hospital census with limited staffing. CSW will continue to follow newborn cord for results and will make CPS report if warranted.   Please contact CSW if mother of baby requests, if needs arise, or if mother of baby scores greater than a nine or answers yes to question ten on [place] Postpartum Depression Screen.   Edwin Dadaarol Dacie Mandel, MSW, LCSW-A Clinical Social Worker [facility] [phone]

## 2017-12-28 NOTE — Progress Notes (Signed)
Reviewed discharge instructions with patient via Clydie BraunKaren interpreter # [phone], answered any questions or concerns.

## 2017-12-28 NOTE — Progress Notes (Signed)
[place] interpreter Hsar [phone] used to explain upcoming tests on baby, mom's plan of care, baby's plan of care, get food order for Sunday morning, and to answer any questions family had.

## 2018-01-19 ENCOUNTER — Ambulatory Visit: Payer: Medicaid Other | Admitting: Family Medicine

## 2018-02-13 ENCOUNTER — Ambulatory Visit (INDEPENDENT_AMBULATORY_CARE_PROVIDER_SITE_OTHER): Payer: Medicaid Other | Admitting: Family Medicine

## 2018-02-13 ENCOUNTER — Encounter: Payer: Self-pay | Admitting: Family Medicine

## 2018-02-13 VITALS — BP 102/66 | HR 54 | Wt 94.0 lb

## 2018-02-13 DIAGNOSIS — Z3202 Encounter for pregnancy test, result negative: Secondary | ICD-10-CM

## 2018-02-13 DIAGNOSIS — Z3046 Encounter for surveillance of implantable subdermal contraceptive: Secondary | ICD-10-CM | POA: Diagnosis not present

## 2018-02-13 DIAGNOSIS — Z01812 Encounter for preprocedural laboratory examination: Secondary | ICD-10-CM

## 2018-02-13 DIAGNOSIS — Z30017 Encounter for initial prescription of implantable subdermal contraceptive: Secondary | ICD-10-CM

## 2018-02-13 DIAGNOSIS — Z1389 Encounter for screening for other disorder: Secondary | ICD-10-CM | POA: Diagnosis not present

## 2018-02-13 LAB — POCT URINE PREGNANCY: PREG TEST UR: NEGATIVE

## 2018-02-13 MED ORDER — ETONOGESTREL 68 MG ~~LOC~~ IMPL
68.0000 mg | DRUG_IMPLANT | Freq: Once | SUBCUTANEOUS | Status: AC
  Administered 2018-02-13: 68 mg via SUBCUTANEOUS

## 2018-02-13 NOTE — Progress Notes (Signed)
Post Partum Exam  Sylvia Bullock is a 22 y.o. [id number] female who presents for a postpartum visit. She is 7 weeks postpartum following a spontaneous vaginal delivery. I have fully reviewed the prenatal and intrapartum course. The delivery was at 39 gestational weeks.  Anesthesia: none. Postpartum course has been uneventful. Baby's course has been uneventful . Baby is feeding by bottle . Bleeding no bleeding. Bowel function is normal. Bladder function is normal. Patient is sexually active. Contraception method is Nexplanon. Postpartum depression screening:neg (score 2)  Last pap smear done 09/23/17 and was Normal   Interpreter #[id number]  Review of Systems Pertinent items are noted in HPI.    Objective:  Last menstrual period 03/28/2017, unknown if currently breastfeeding.  General:  alert, cooperative and no distress  Lungs: clear to auscultation bilaterally  Heart:  regular rate and rhythm, S1, S2 normal, no murmur, click, rub or gallop  Abdomen: soft, non-tender; bowel sounds normal; no masses,  no organomegaly        Nexplanon Insertion:  Patient given informed consent, signed copy in the chart, time out was performed. Pregnancy test was negative. Appropriate time out taken.  Patient's left arm was prepped and draped in the usual sterile fashion.. The ruler used to measure and mark insertion area.  Pt was prepped with alcohol swab and then injected with 3 cc of 1% lidocaine with epinephrine.  Pt was prepped with betadine, Implanon removed form packaging,  Device confirmed in needle, then inserted full length of needle and withdrawn per handbook instructions.  Device palpated by physician and patient.  Pt insertion site covered with pressure dressing.   Minimal blood loss.  Pt tolerated the procedure well.    Assessment:    normal postpartum exam. Pap smear not done at today's visit.   Plan:   1. Contraception: Nexplanon 2. Follow up in: 3 months or as needed.

## 2018-04-23 ENCOUNTER — Encounter: Payer: Self-pay | Admitting: *Deleted

## 2018-05-28 IMAGING — US US MFM UA CORD DOPPLER
1 series · 12 of 28 positions shown · non-contrast
Comparison: none

[Series 1: us mfm ua cord doppler · 12 of 115 slices shown]
[im 5/115]
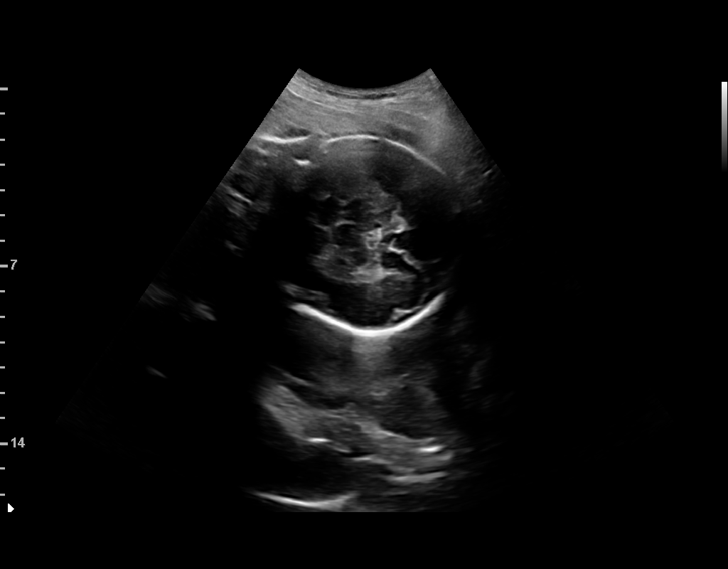
[im 13/115]
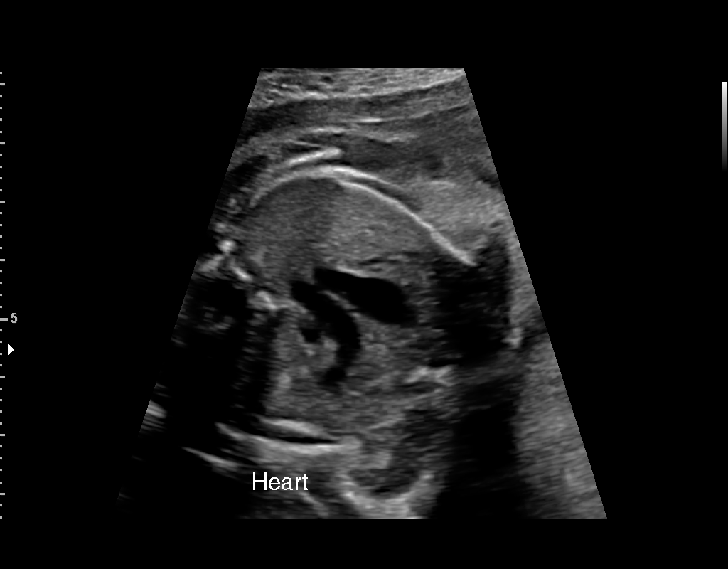
[im 22/115]
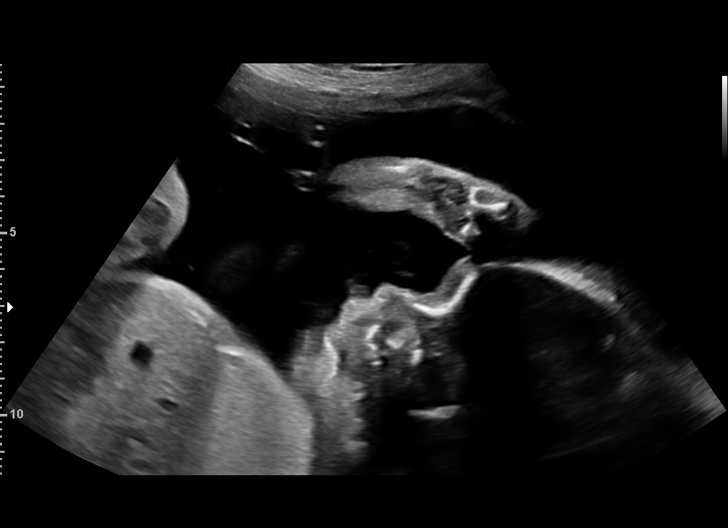
[im 34/115]
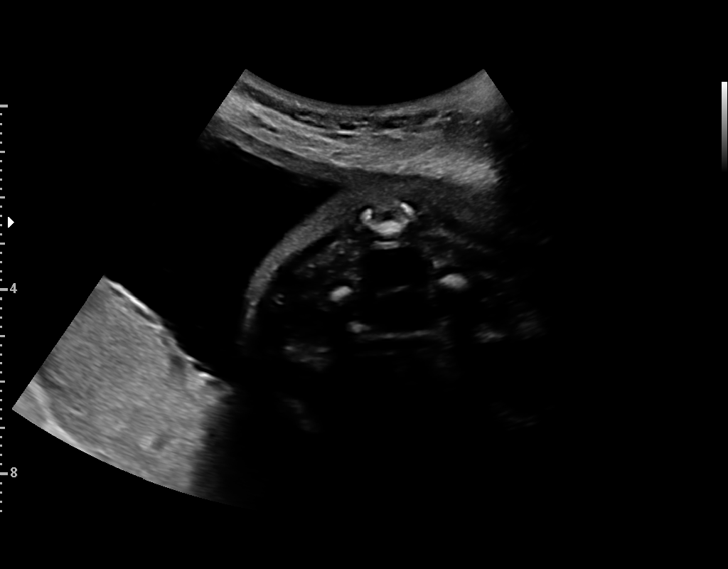
[im 43/115]
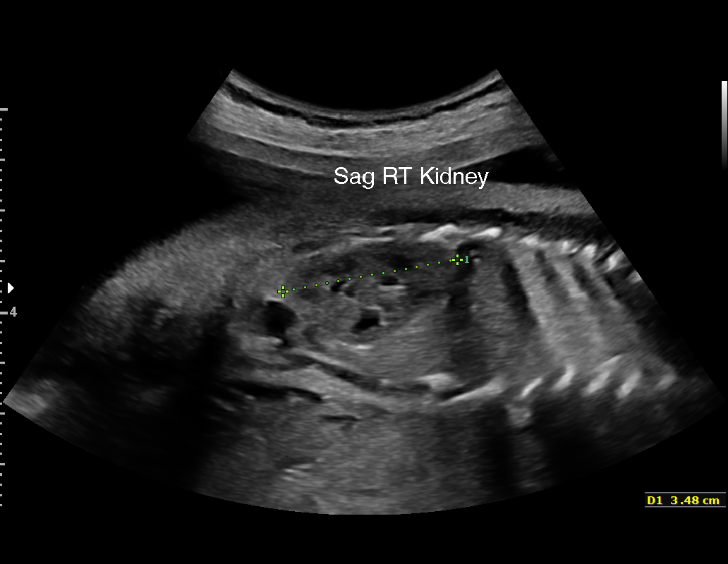
[im 51/115]
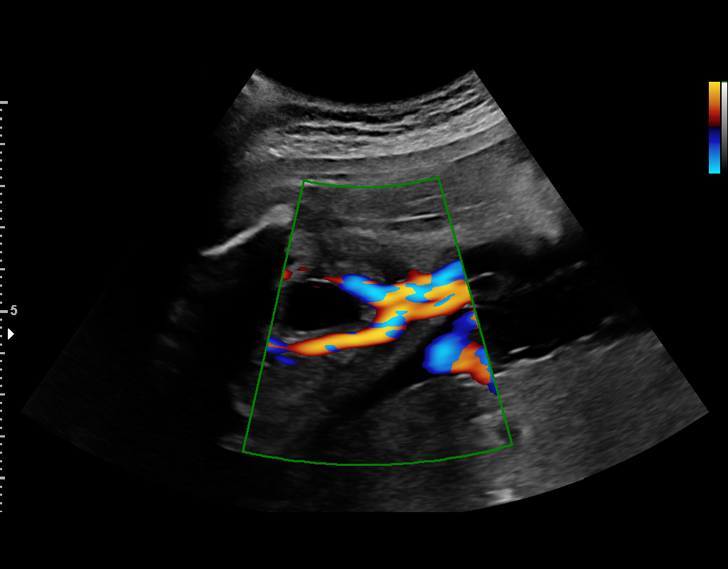
[im 64/115]
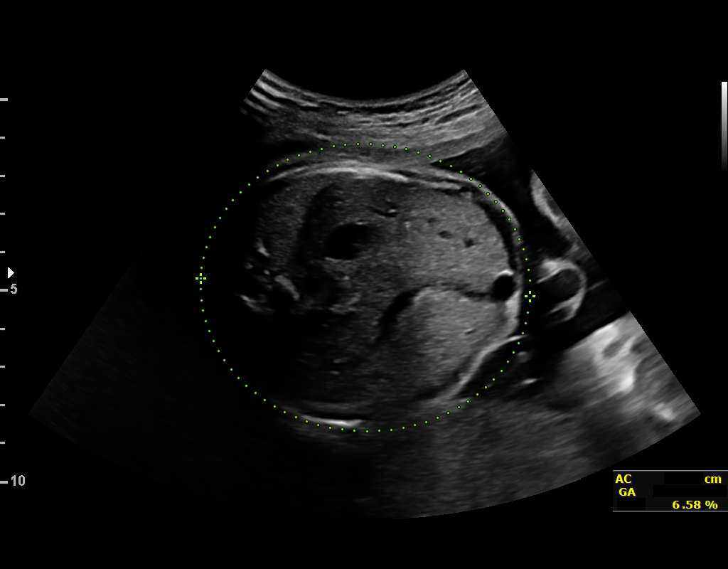
[im 72/115]
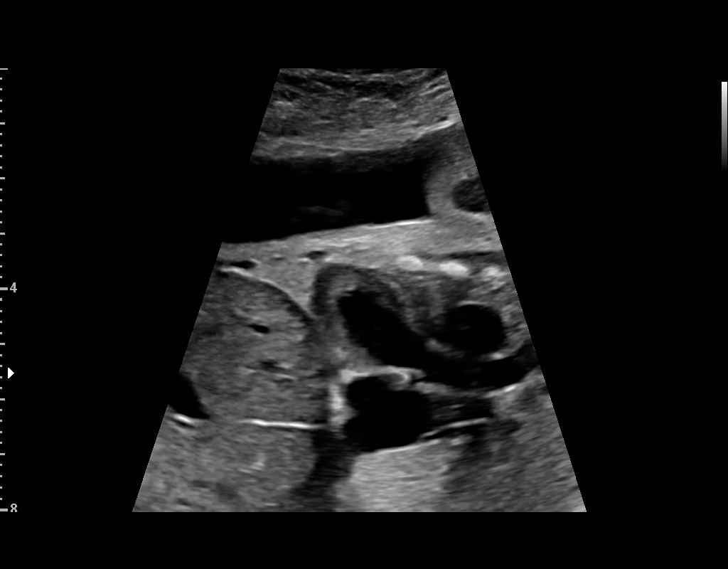
[im 81/115]
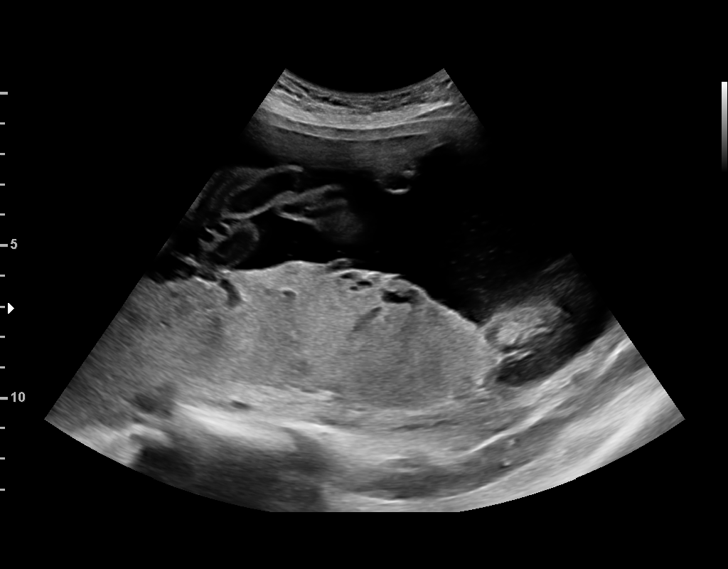
[im 93/115]
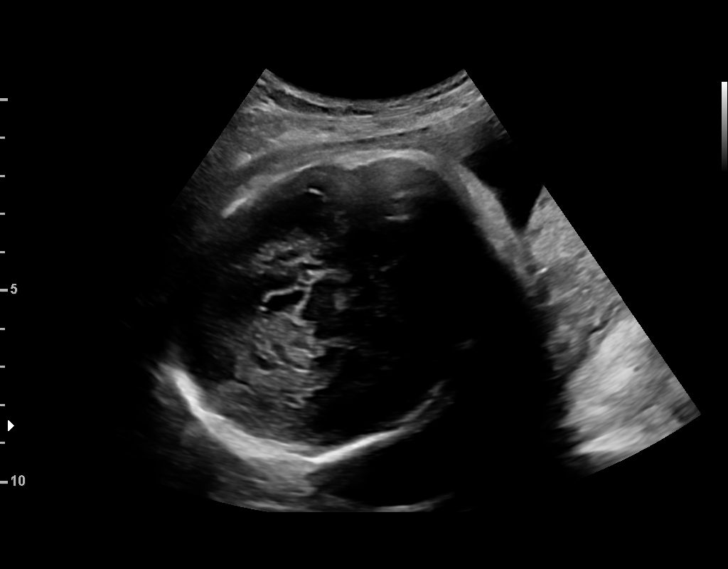
[im 102/115]
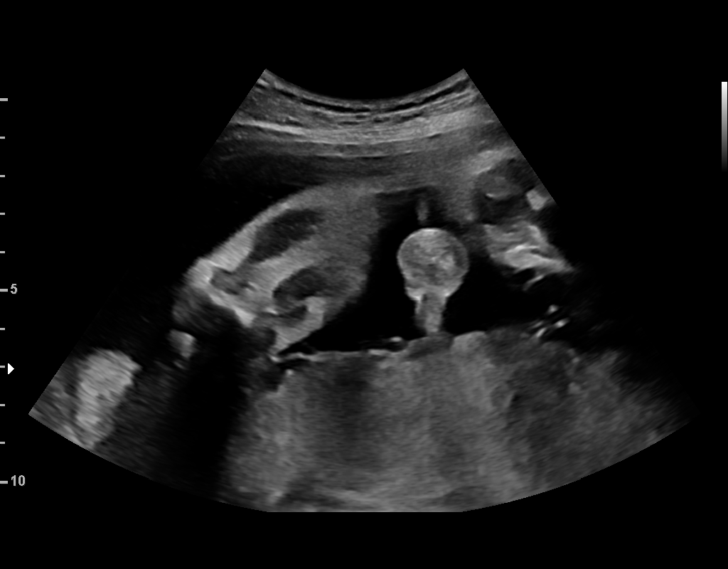
[im 110/115]
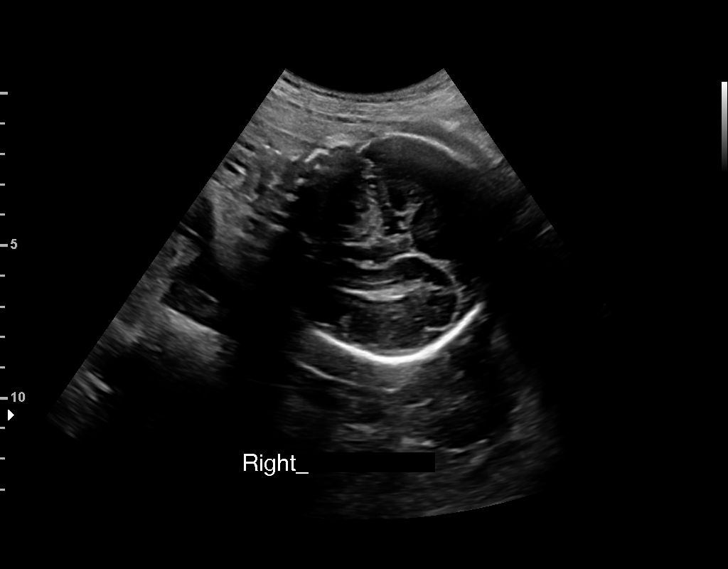

[12 of 28 positions shown; findings below may reference images not displayed]

Indications

31 weeks gestation of pregnancy
Encounter for antenatal screening for
malformations
Late to prenatal care, third trimester
Poor obstetric history: Previous preterm
delivery, antepartum (x 3)
Encounter for fetal growth retardation
OB History

Blood Type:            Height:  5'2"   Weight (lb):  110       BMI:
Gravidity:    4         Prem:   3
Living:       2
Fetal Evaluation

Num Of Fetuses:     1
Fetal Heart         145
Rate(bpm):
Cardiac Activity:   Observed
Presentation:       Cephalic
Placenta:           Posterior, above cervical os
P. Cord Insertion:  Visualized

Amniotic Fluid
AFI FV:      Subjectively within normal limits

AFI Sum(cm)     %Tile       Largest Pocket(cm)
15.16           54
RUQ(cm)       RLQ(cm)       LUQ(cm)        LLQ(cm)
4.05
Biophysical Evaluation

Amniotic F.V:   Pocket => 2 cm two         F. Tone:        Observed
planes
F. Movement:    Observed                   Score:          [DATE]
F. Breathing:   Observed
Biometry

BPD:        73  mm     G. Age:  29w 2d          2  %    CI:        71.73   %    70 - 86
FL/HC:      18.8   %    19.3 -
HC:      274.4  mm     G. Age:  30w 0d        < 3  %    HC/AC:      1.09        0.96 -
AC:      251.9  mm     G. Age:  29w 3d          5  %    FL/BPD:     70.7   %    71 - 87
FL:       51.6  mm     G. Age:  27w 4d        < 3  %    FL/AC:      20.5   %    20 - 24
HUM:      48.5  mm     G. Age:  28w 4d        < 5  %
Est. FW:    5715  gm    2 lb 14 oz      14  %
Gestational Age

LMP:           31w 3d        Date:  03/28/17                 EDD:   01/02/18
U/S Today:     29w 1d                                        EDD:   01/18/18
Best:          31w 3d     Det. By:  LMP  (03/28/17)          EDD:   01/02/18
Anatomy

Cranium:               Appears normal         Aortic Arch:            Appears normal
Cavum:                 Appears normal         Ductal Arch:            Appears normal
Ventricles:            Appears normal         Diaphragm:              Appears normal
Choroid Plexus:        Appears normal         Stomach:                Appears normal, left
sided
Cerebellum:            Appears normal         Abdomen:                Appears normal
Posterior Fossa:       Appears normal         Abdominal Wall:         Appears nml (cord
insert, abd wall)
Nuchal Fold:           Not applicable (>20    Cord Vessels:           Appears normal (3
wks GA)                                        vessel cord)
Face:                  Appears normal         Kidneys:                Appear normal
(orbits and profile)
Lips:                  Appears normal         Bladder:                Appears normal
Thoracic:              Appears normal         Spine:                  Appears normal
Heart:                 Appears normal         Upper Extremities:      Visualized
(4CH, axis, and situs
RVOT:                  Appears normal         Lower Extremities:      Visualized
LVOT:                  Appears normal

Other:  Male gender. Technically difficult due to advanced gestational age.
Doppler - Fetal Vessels

Umbilical Artery
S/D     %tile     RI              PI              PSV    ADFV    RDFV
(cm/s)
3.1       67   0.68              1.1             46.93      No      No

Impression

Singleton intrauterine pregnancy at 31+3 weeks with
suspected IUGR here for evaluation
Review of the anatomy shows no sonographic markers for
aneuploidy or structural anomalies
All relevant fetal anatomy has been visualized
Amniotic fluid volume is normal
Estimated fetal weight shows growth in the percentile; he
abdominal circumference is in the 5th percentile
Normal umbilical artery dopplers with no absent or reversed
end-diastolic flow
BPP [DATE]
Recommendations

Recommend follow-up ultrasound examination in 1 week for
BPP and doppler studies

## 2018-06-04 IMAGING — US US MFM UA CORD DOPPLER
1 series · 15 of 28 positions shown · non-contrast
Comparison: none

[Series 1: us mfm ua cord doppler · 40 acquisitions, 15 frames shown]
[im 1/40]
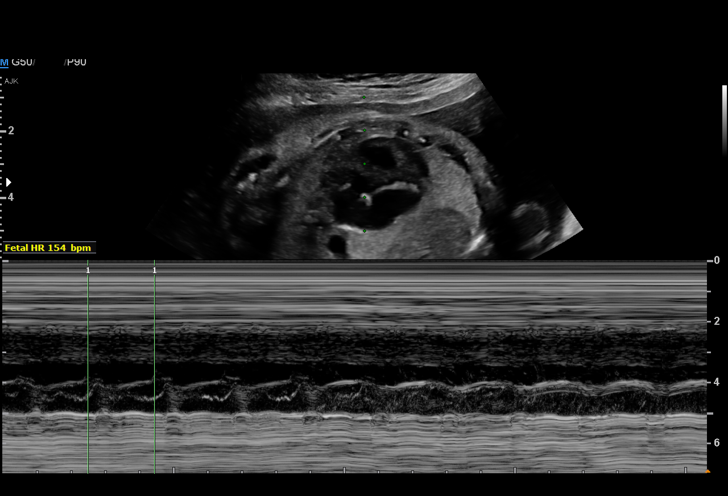
[im 3/40]
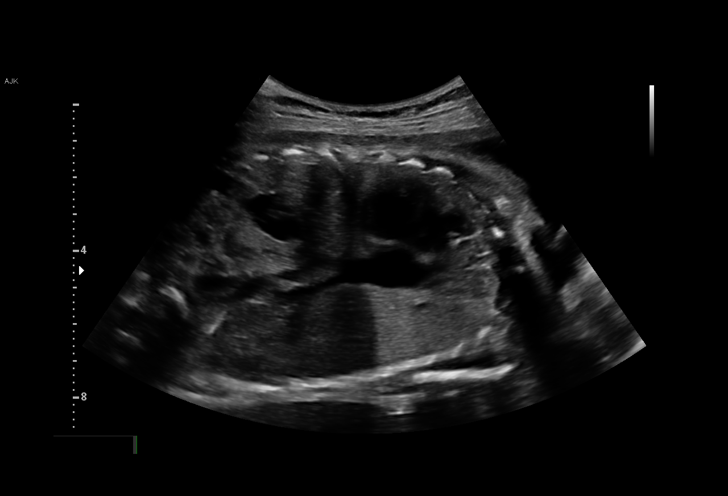
[im 6/40]
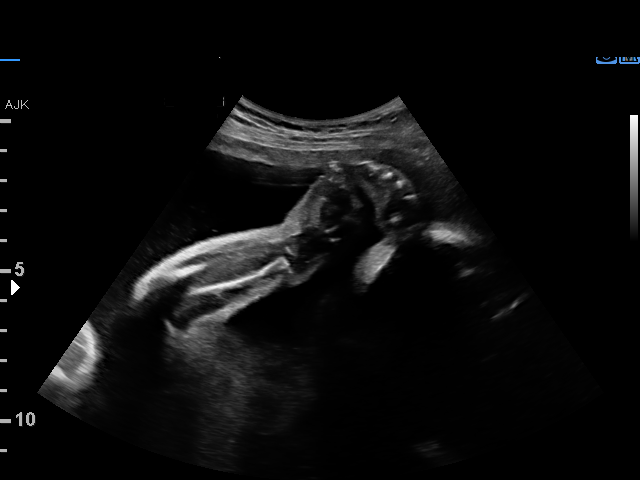
[im 9/40]
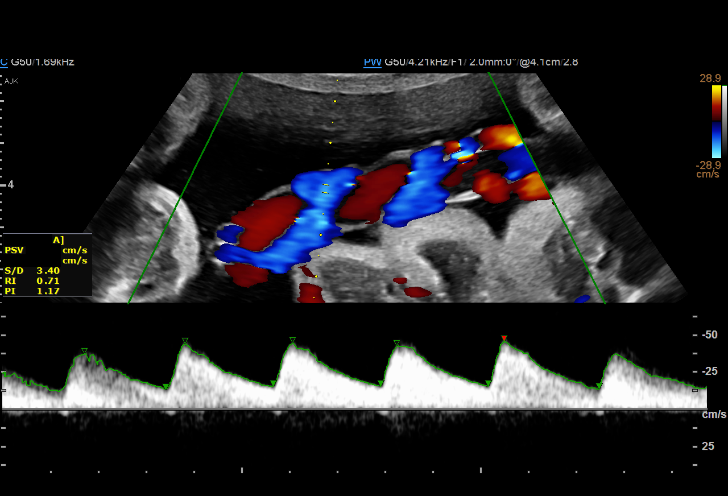
[im 12/40]
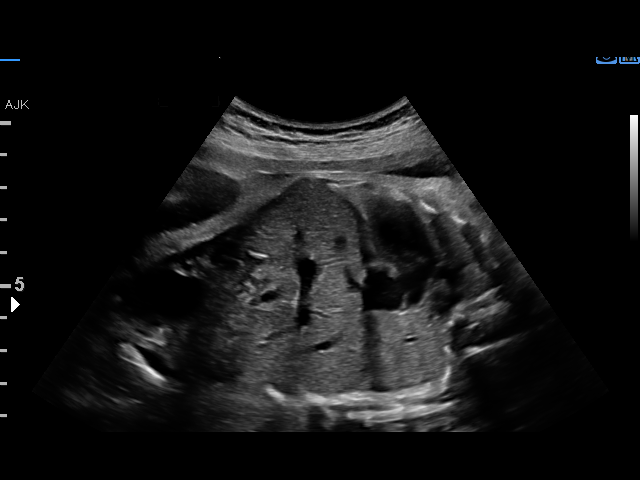
[im 15/40]
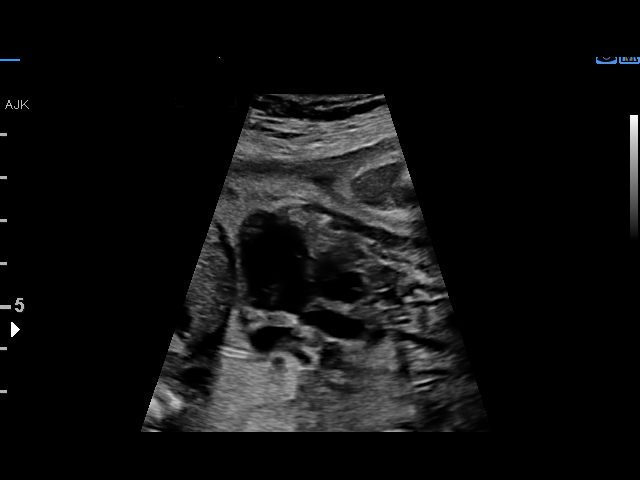
[im 18/40]
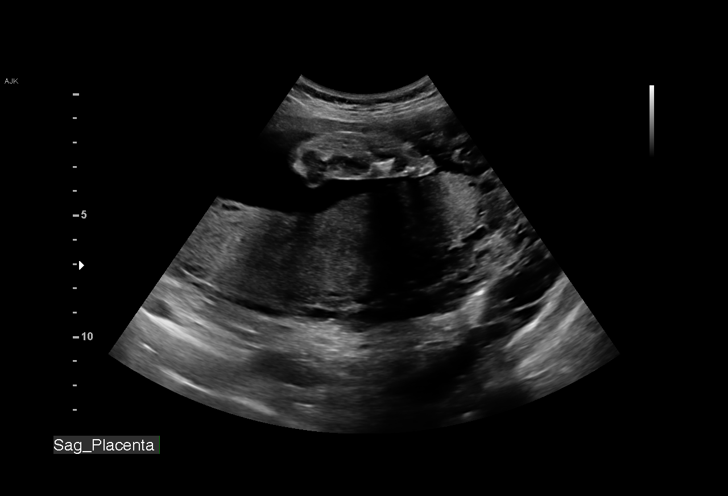
[im 21/40]
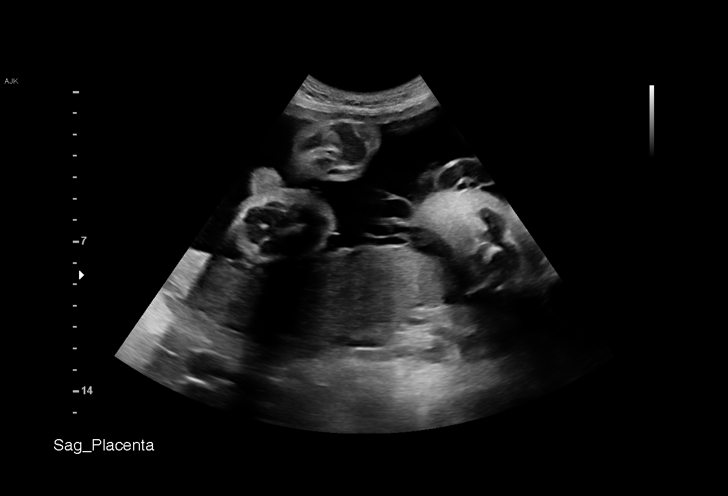
[im 22/40]
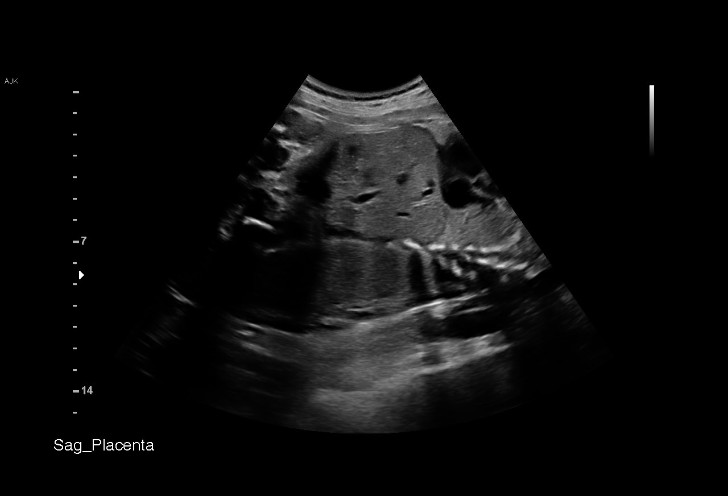
[im 25/40]
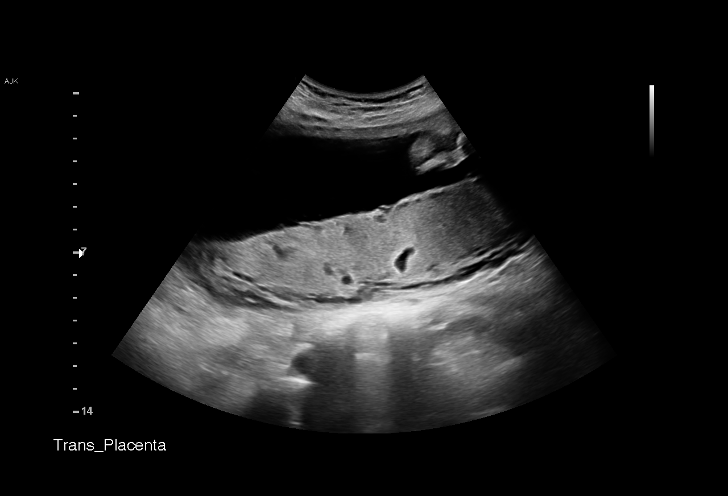
[im 28/40]
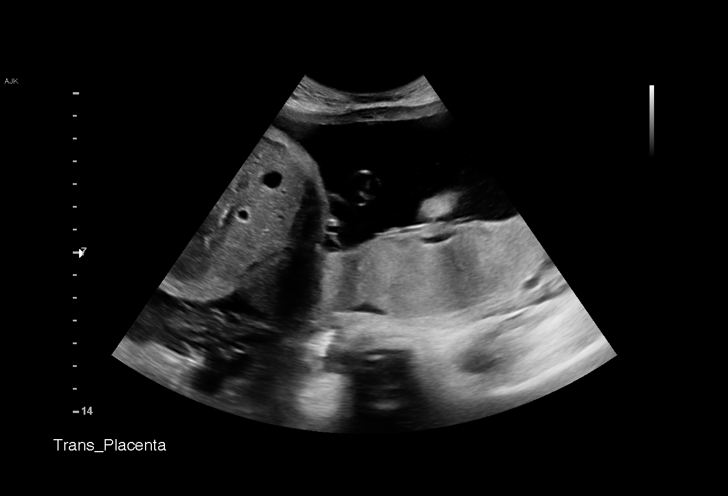
[im 31/40]
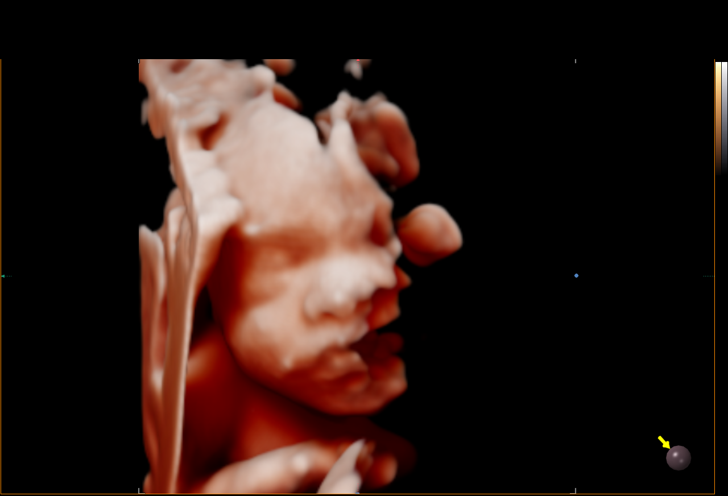
[im 34/40]
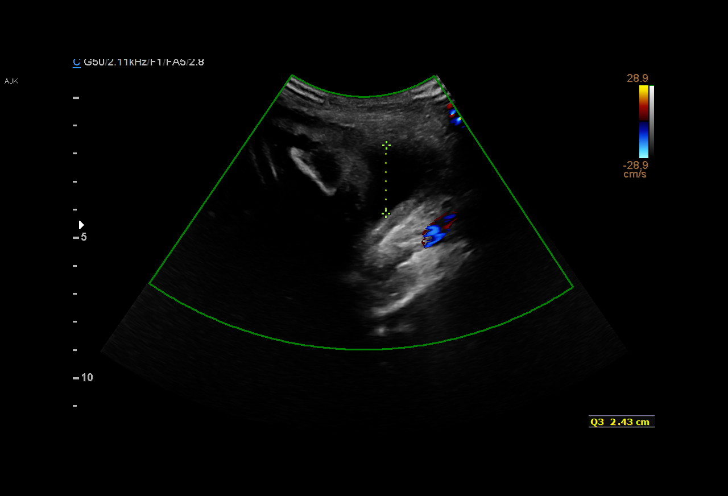
[im 37/40]
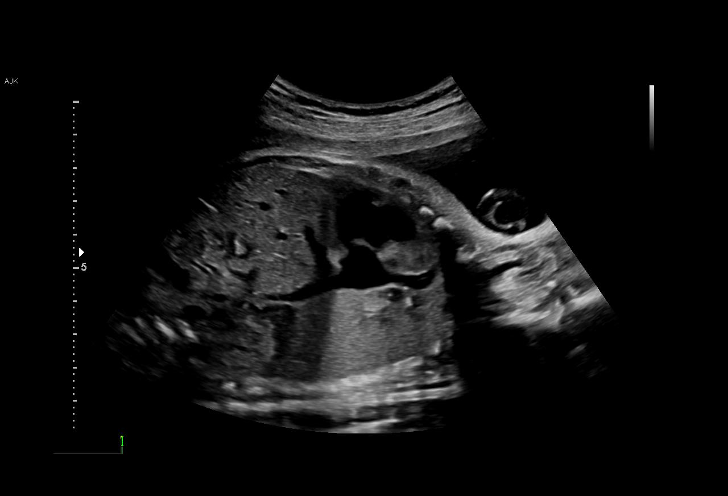
[im 40/40]
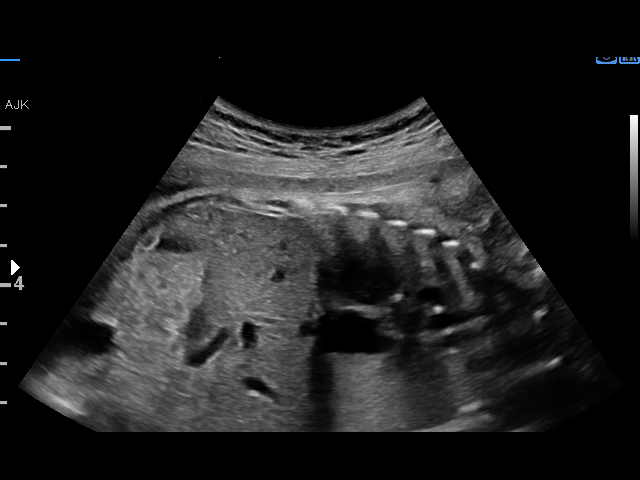

[15 of 28 positions shown; findings below may reference images not displayed]

1  CASSONDRA CAPPELLO              [id number]      [id number]     [id number]
2  CASSONDRA CAPPELLO              [id number]      [id number]     [id number]
Indications

32 weeks gestation of pregnancy
Late to prenatal care, third trimester
Poor obstetric history: Previous preterm
delivery, antepartum (x 3)
Encounter for fetal growth retardation
OB History

Blood Type:            Height:  5'2"   Weight (lb):  110       BMI:
Gravidity:    4         Prem:   3
Living:       2
Fetal Evaluation

Num Of Fetuses:     1
Fetal Heart         154
Rate(bpm):
Cardiac Activity:   Observed
Presentation:       Cephalic
Placenta:           Posterior, above cervical os
P. Cord Insertion:  Visualized

Amniotic Fluid
AFI FV:      Subjectively within normal limits

AFI Sum(cm)     %Tile       Largest Pocket(cm)
15.82           57

RUQ(cm)       RLQ(cm)       LUQ(cm)        LLQ(cm)
4.89
Biophysical Evaluation

Amniotic F.V:   Within normal limits       F. Tone:        Observed
F. Movement:    Observed                   Score:          [DATE]
F. Breathing:   Observed
Gestational Age

LMP:           32w 3d        Date:  03/28/17                 EDD:   01/02/18
Best:          32w 3d     Det. By:  LMP  (03/28/17)          EDD:   01/02/18
Doppler - Fetal Vessels

Umbilical Artery
S/D     %tile     RI              PI              PSV    ADFV    RDFV
(cm/s)
3.1       71   0.68             1.07             47.13      No      No

Impression

Intrauterine pregnancy at 32+3 weeks with suspected IUGR
Normal fetal cardiac activity
Normal amniotic fluid
Normal umbilical artery dopplers with no absent or reversed
end-diastolic flow
BPP [DATE]
Recommendations

Repeat BPP and dopplers in 1 week

## 2018-07-17 IMAGING — US US MFM FETAL BPP W/O NON-STRESS
1 series · 14 of 28 positions shown · non-contrast
Comparison: none

[Series 1: us mfm fetal bpp w/o non-stress · 47 acquisitions, 14 frames shown]
[im 2/47]
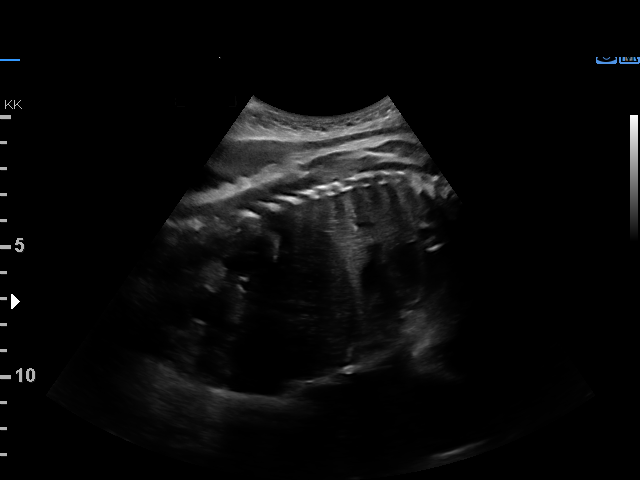
[im 6/47]
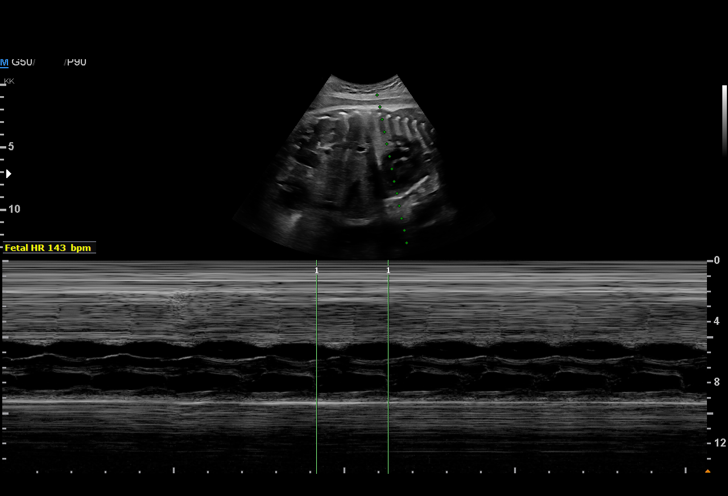
[im 9/47]
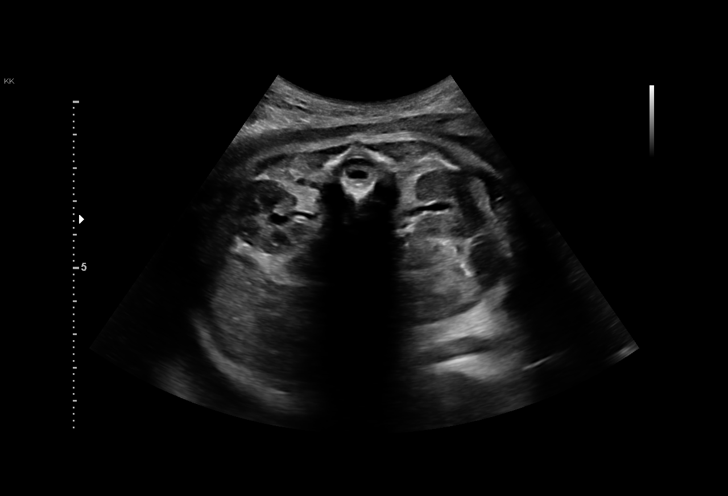
[im 12/47]
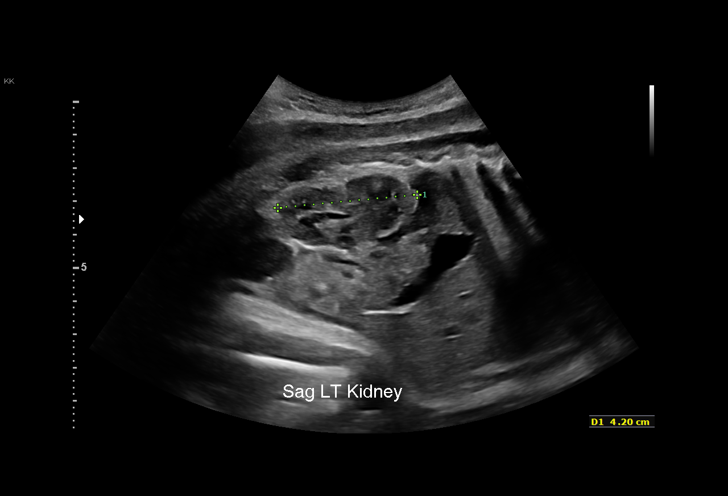
[im 16/47]
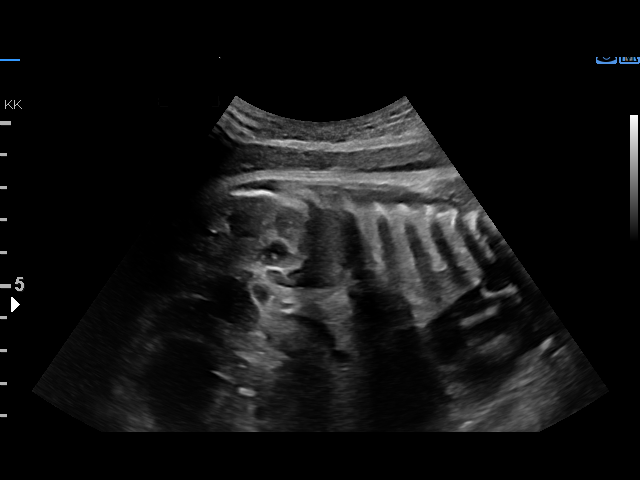
[im 19/47]
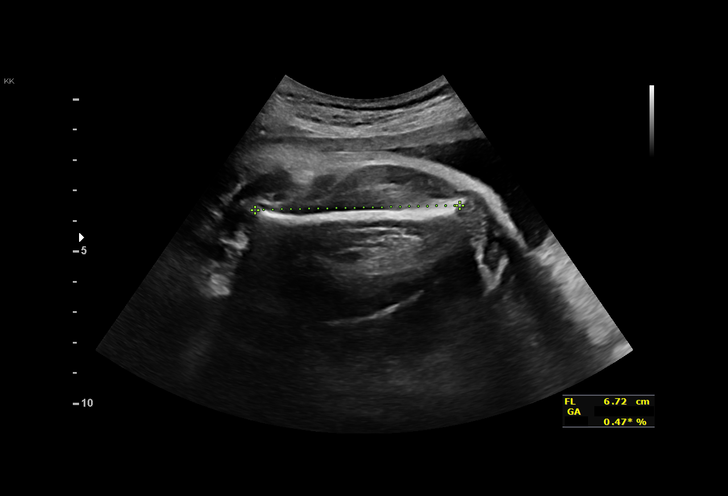
[im 23/47]
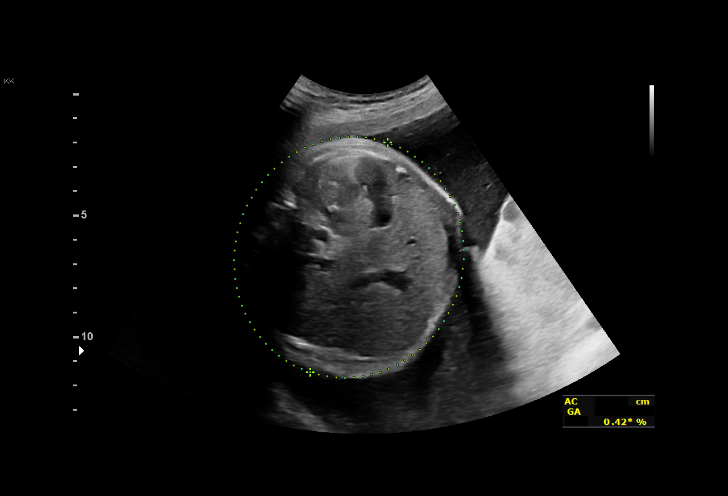
[im 26/47]
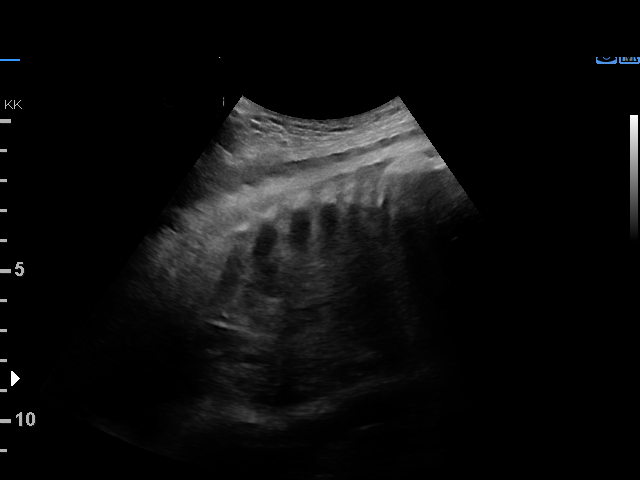
[im 29/47]
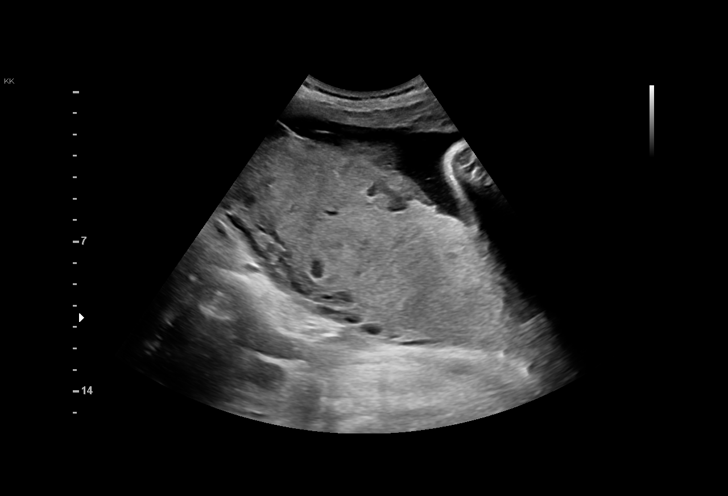
[im 33/47]
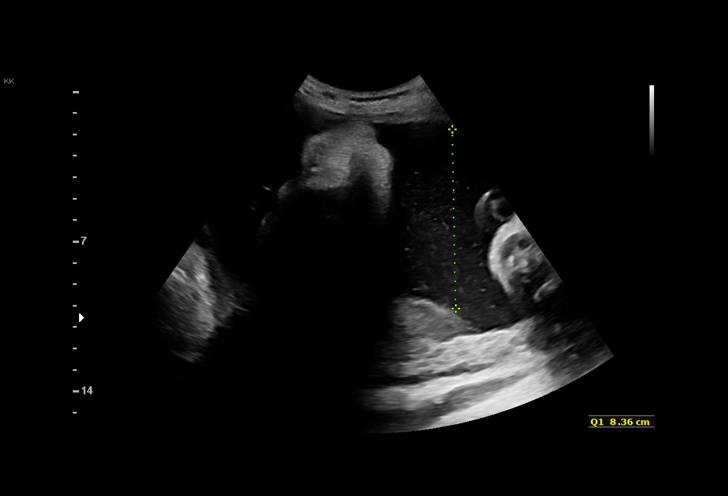
[im 36/47]
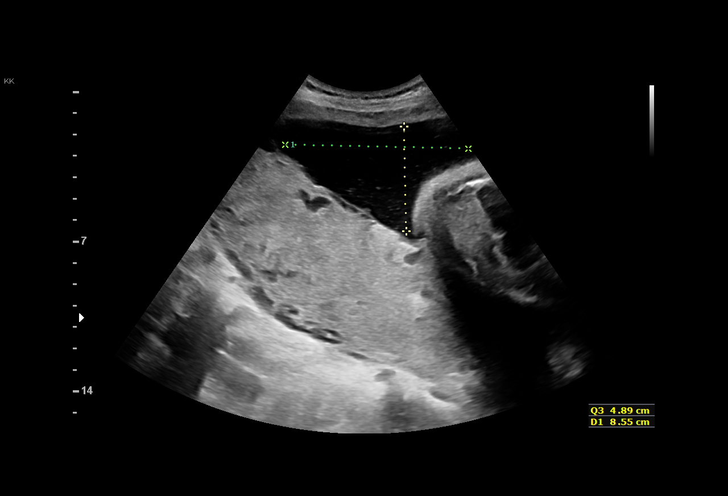
[im 40/47]
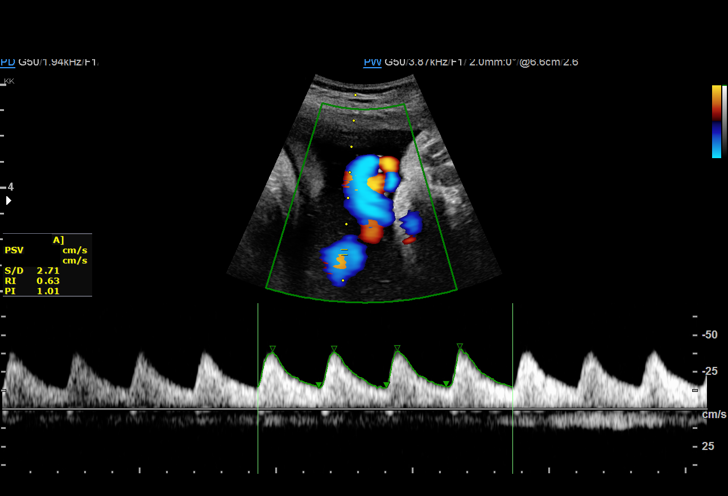
[im 43/47]
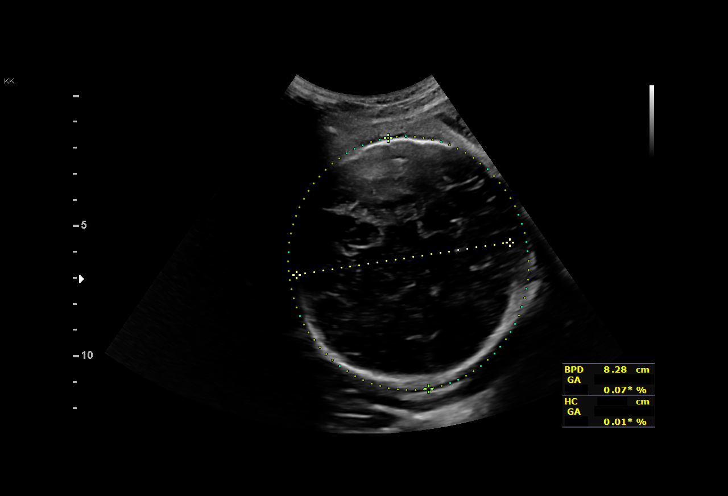
[im 47/47]
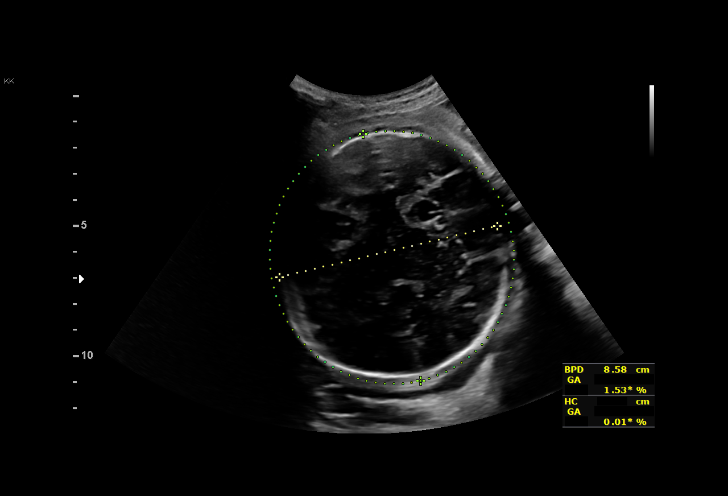

[14 of 28 positions shown; findings below may reference images not displayed]

1  FARRAN MHLANGA            [id number]      [id number]     [id number]
2  FARRAN MHLANGA            [id number]      [id number]     [id number]
3  FARRAN MHLANGA            [id number]      [id number]     [id number]
Indications

38 weeks gestation of pregnancy
Late to prenatal care, third trimester
Poor obstetric history: Previous preterm
delivery, antepartum (x 3)
Encounter for fetal growth retardation
Maternal care for known or suspected poor
fetal growth, third trimester, not applicable or
unspecified
OB History

Blood Type:            Height:  5'2"   Weight (lb):  110       BMI:
Gravidity:    4         Prem:   3
Living:       2
Fetal Evaluation

Num Of Fetuses:     1
Fetal Heart         143
Rate(bpm):
Cardiac Activity:   Observed
Presentation:       Cephalic
Placenta:           Posterior, above cervical os
P. Cord Insertion:  Previously Visualized

Amniotic Fluid
AFI FV:      Subjectively within normal limits
AFI Sum(cm)     %Tile       Largest Pocket(cm)
24.99           96

RUQ(cm)       RLQ(cm)       LUQ(cm)        LLQ(cm)
8.36
Biophysical Evaluation

Amniotic F.V:   Pocket => 2 cm two         F. Tone:        Observed
planes
F. Movement:    Observed                   Score:          [DATE]
F. Breathing:   Observed
Biometry

BPD:      84.3  mm     G. Age:  33w 6d          0  %    CI:        79.51   %    70 - 86
FL/HC:      22.3   %    20.6 -
HC:      298.8  mm     G. Age:  33w 1d        < 3  %    HC/AC:      0.98        0.87 -
AC:      305.2  mm     G. Age:  34w 4d        < 3  %    FL/BPD:     78.9   %    71 - 87
FL:       66.5  mm     G. Age:  34w 2d        < 3  %    FL/AC:      21.8   %    20 - 24

Est. FW:    8445  gm      5 lb 4 oz   < 10  %
Gestational Age

LMP:           38w 4d        Date:  03/28/17                 EDD:   01/02/18
U/S Today:     34w 0d                                        EDD:   02/03/18
Best:          38w 4d     Det. By:  LMP  (03/28/17)          EDD:   01/02/18
Anatomy

Cranium:               Appears normal         Aortic Arch:            Appears normal
Cavum:                 Previously seen        Ductal Arch:            Previously seen
Ventricles:            Previously seen        Diaphragm:              Previously seen
Choroid Plexus:        Previously seen        Stomach:                Appears normal, left
sided
Cerebellum:            Previously seen        Abdomen:                Appears normal
Posterior Fossa:       Previously seen        Abdominal Wall:         Previously seen
Nuchal Fold:           Not applicable (>20    Cord Vessels:           Previously seen
wks GA)
Face:                  Orbits and profile     Kidneys:                Appear normal
previously seen
Lips:                  Previously seen        Bladder:                Appears normal
Thoracic:              Appears normal         Spine:                  Previously seen
Heart:                 Previously seen        Upper Extremities:      Previously seen
RVOT:                  Previously seen        Lower Extremities:      Previously seen
LVOT:                  Previously seen

Other:  Male gender. Technically difficult due to advanced gestational age.
Doppler - Fetal Vessels

Umbilical Artery
S/D     %tile     RI              PI              PSV    ADFV    RDFV
(cm/s)
2.62       69   0.62             0.97             47.76      No      No

Cervix Uterus Adnexa
Cervix
Not visualized (advanced GA >81wks)
Impression

Singleton intrauterine pregnancy at weeks with suspected
IUGR
Interval review of the anatomy shows no sonographic
markers for aneuploidy or structural anomalies
All relevant fetal anatomy has been visualized
Amniotic fluid volume is normal
Estimated fetal weight shows growth at <10th percentile
Normal umbilical artery dopplers with no absent or reversed
end-diastolic flow
BPP [DATE]
Recommendations

Recommend delivery at 39 weeks

## 2018-08-13 ENCOUNTER — Ambulatory Visit: Payer: Medicaid Other | Admitting: Obstetrics & Gynecology

## 2018-08-14 ENCOUNTER — Encounter: Payer: Self-pay | Admitting: Family Medicine

## 2018-08-14 ENCOUNTER — Ambulatory Visit (INDEPENDENT_AMBULATORY_CARE_PROVIDER_SITE_OTHER): Payer: Medicaid Other | Admitting: Family Medicine

## 2018-08-14 VITALS — BP 98/66 | HR 75 | Wt 82.0 lb

## 2018-08-14 DIAGNOSIS — Z975 Presence of (intrauterine) contraceptive device: Principal | ICD-10-CM

## 2018-08-14 DIAGNOSIS — N921 Excessive and frequent menstruation with irregular cycle: Secondary | ICD-10-CM

## 2018-08-14 DIAGNOSIS — Z789 Other specified health status: Secondary | ICD-10-CM

## 2018-08-14 MED ORDER — MEDROXYPROGESTERONE ACETATE 10 MG PO TABS: 10.0000 mg | ORAL_TABLET | Freq: Every day | ORAL | 2 refills | Status: DC

## 2018-08-14 MED ORDER — FERROUS SULFATE 325 (65 FE) MG PO TABS: 325.0000 mg | ORAL_TABLET | Freq: Two times a day (BID) | ORAL | 1 refills | Status: DC

## 2018-08-14 NOTE — Progress Notes (Signed)
   Subjective:    Patient ID: Sylvia Bullock, female    DOB: 21-Mar-1996, 23 y.o.   MRN: [id number]  HPI Patient seen for follow up of nexplanon insertion approximately 6 months ago. Has been having vaginal bleeding nearly every day since insertion. Having some dizziness occasionally.    Review of Systems     Objective:   Physical Exam Constitutional:      Appearance: Normal appearance.  Abdominal:     General: Abdomen is flat. There is no distension.     Palpations: Abdomen is soft. There is no mass.     Tenderness: There is no abdominal tenderness. There is no guarding or rebound.     Hernia: No hernia is present.  Neurological:     Mental Status: She is alert.  Psychiatric:        Mood and Affect: Mood normal.        Behavior: Behavior normal.        Thought Content: Thought content normal.        Judgment: Judgment normal.       Assessment & Plan:  1. Breakthrough bleeding on Nexplanon Will start provera 10mg  daily  2. Language barrier, speaks [name] only Equities trader used

## 2018-08-14 NOTE — Progress Notes (Signed)
Patient is having bleeding daily since having the Nexplanon. Armandina Stammer RN Interpreter # [phone]

## 2018-09-14 ENCOUNTER — Other Ambulatory Visit: Payer: Self-pay

## 2018-09-14 ENCOUNTER — Ambulatory Visit (INDEPENDENT_AMBULATORY_CARE_PROVIDER_SITE_OTHER): Payer: Medicaid Other | Admitting: Family Medicine

## 2018-09-14 ENCOUNTER — Encounter: Payer: Self-pay | Admitting: Family Medicine

## 2018-09-14 VITALS — BP 97/68 | HR 74 | Ht 62.0 in | Wt 79.1 lb

## 2018-09-14 DIAGNOSIS — Z789 Other specified health status: Secondary | ICD-10-CM

## 2018-09-14 DIAGNOSIS — D500 Iron deficiency anemia secondary to blood loss (chronic): Secondary | ICD-10-CM | POA: Diagnosis not present

## 2018-09-14 DIAGNOSIS — Z713 Dietary counseling and surveillance: Secondary | ICD-10-CM

## 2018-09-14 DIAGNOSIS — Z975 Presence of (intrauterine) contraceptive device: Secondary | ICD-10-CM

## 2018-09-14 DIAGNOSIS — N921 Excessive and frequent menstruation with irregular cycle: Secondary | ICD-10-CM | POA: Diagnosis not present

## 2018-09-14 MED ORDER — ENSURE COMPLETE PO LIQD: 237.0000 mL | Freq: Two times a day (BID) | ORAL | 3 refills | Status: DC

## 2018-09-14 MED ORDER — FERROUS SULFATE 300 (60 FE) MG/5ML PO SYRP: 300.0000 mg | ORAL_SOLUTION | Freq: Two times a day (BID) | ORAL | 3 refills | Status: DC

## 2018-09-14 NOTE — Progress Notes (Signed)
[employer] interpreter Fleet Contras [phone]. Pt states that bleeding has stopped.

## 2018-09-14 NOTE — Progress Notes (Signed)
   Subjective:    Patient ID: Sylvia Bullock, female    DOB: 05-09-1996, 23 y.o.   MRN: [id number]  HPI Patient seen for follow-up of breakthrough bleeding on Nexplanon.  Patient took Provera, which improved her bleeding.  She just had a little bit of spotting yesterday and the day before.  Additionally, the patient was noted to have a fairly significant weight loss since her delivery: She has lost nearly 15 pounds in -14.5.  She states that she only eats approximately twice a day and has decreased appetite.  No fevers, chills, night sweats.  Review of Systems     Objective:   Physical Exam Constitutional:      General: She is not in acute distress. HENT:     Head: Normocephalic and atraumatic.  Cardiovascular:     Rate and Rhythm: Normal rate.  Abdominal:     General: Abdomen is flat. There is no distension.     Palpations: Abdomen is soft.  Skin:    General: Skin is warm and dry.     Capillary Refill: Capillary refill takes less than 2 seconds.  Neurological:     Mental Status: She is alert.       Assessment & Plan:  1. Breakthrough bleeding on Nexplanon Improved  2. Iron deficiency anemia due to chronic blood loss Liquid iron prescribed  3. Weight loss counseling, encounter for Discussed eating 3 meals a day.  Patient declined being sent to nutritionist.  Will prescribe Ensure twice a day between meals.  We will follow-up in 3 months  4. Language barrier, speaks [name] only Equities trader used

## 2019-07-02 NOTE — L&D Delivery Note (Signed)
OB/GYN Faculty Practice Delivery Note  Sylvia Bullock is a 24 y.o. [id number] s/p SVD at [redacted]w[redacted]d. She was admitted for labor.   ROM: 0h 16m with clear fluid GBS Status: neg   Maximum Maternal Temperature: 98  Labor Progress: . Initial SVE: Patient arrived complete and 0 station. She was brought up to L&D and immediately delivered.   Delivery Date/Time: [id number] on 12/11 Delivery: Accompanied patient from MAU to L&D and patient was complete and pushing upon arrival. Head delivered . No nuchal cord present. Shoulder and body delivered in usual fashion. Infant with spontaneous cry, placed on mother's abdomen, dried and stimulated. Cord clamped x 2 after 1-minute delay, and cut by [facility]. Cord blood drawn. Placenta delivered spontaneously with gentle cord traction. Fundus firm with massage and Pitocin. Labia, perineum, vagina, and cervix inspected inspected with small first degree laceration, hemostatic and not repaired. IM Pitocin and 800 mcg rectal cytotec given.   Baby Weight: pending  Placenta: Sent to L&D Complications: None Lacerations: small first degree, hemostatic. Not repaired.  EBL: 150 mL Analgesia: Epidural   Infant:  APGAR (1 MIN): 9   APGAR (5 MINS): 9   APGAR (10 MINS):     Casper Harrison, MD [facility] Family Medicine Fellow, [facility] for [facility], [facility] 06/10/2020, 8:47 AM

## 2019-09-17 ENCOUNTER — Other Ambulatory Visit: Payer: Self-pay

## 2019-09-17 ENCOUNTER — Encounter: Payer: Self-pay | Admitting: Obstetrics & Gynecology

## 2019-09-17 ENCOUNTER — Ambulatory Visit (INDEPENDENT_AMBULATORY_CARE_PROVIDER_SITE_OTHER): Payer: BC Managed Care – PPO | Admitting: Obstetrics & Gynecology

## 2019-09-17 ENCOUNTER — Other Ambulatory Visit (HOSPITAL_COMMUNITY)
Admission: RE | Admit: 2019-09-17 | Discharge: 2019-09-17 | Disposition: A | Discharge status: Home / Self Care | Payer: BC Managed Care – PPO | Source: Ambulatory Visit | Attending: Obstetrics & Gynecology | Admitting: Obstetrics & Gynecology

## 2019-09-17 VITALS — BP 102/55 | HR 62 | Ht 62.0 in | Wt 87.0 lb

## 2019-09-17 DIAGNOSIS — Z01419 Encounter for gynecological examination (general) (routine) without abnormal findings: Secondary | ICD-10-CM

## 2019-09-17 DIAGNOSIS — Z3009 Encounter for other general counseling and advice on contraception: Secondary | ICD-10-CM | POA: Diagnosis not present

## 2019-09-17 DIAGNOSIS — Z3046 Encounter for surveillance of implantable subdermal contraceptive: Secondary | ICD-10-CM

## 2019-09-17 MED ORDER — NORGESTIMATE-ETH ESTRADIOL 0.25-35 MG-MCG PO TABS: 1.0000 | ORAL_TABLET | Freq: Every day | ORAL | 11 refills | Status: DC

## 2019-09-17 NOTE — Progress Notes (Signed)
Subjective:     Sylvia Bullock is a 24 y.o. female here for a routine exam. G4P3 Current complaints: Pt wants Nexplanon removed due to excessive bleeding. She does not want to try options to control bleeding and keep the Nexplanon.     Gynecologic History No LMP recorded. Patient has had an implant. Contraception: Nexplanon Last Pap: n/a.  Last mammogram: n/a  Obstetric History OB History  Gravida Para Term Preterm AB Living  4 4 1 3   3   SAB TAB Ectopic Multiple Live Births        0 4    # Outcome Date GA Lbr Len/2nd Weight Sex Delivery Anes PTL Lv  4 Term 12/26/17 [redacted]w[redacted]d / 00:04 5 lb 12.2 oz (2.614 kg) M Vag-Spont EPI  LIV  3 Preterm 06/02/16    F Vag-Spont  Y LIV  2 Preterm 08/15/15 [redacted]w[redacted]d / 00:15 5 lb 5.7 oz (2.43 kg) M Vag-Spont None  LIV  1 Preterm 03/30/14 [redacted]w[redacted]d   F Vag-Spont None  ND    Obstetric Comments  Delivered infant at 21 weeks but died shortly after birth due to prematurity.     The following portions of the patient's history were reviewed and updated as appropriate: allergies, current medications, past family history, past medical history, past social history, past surgical history and problem list.  Review of Systems Pertinent items are noted in HPI.    Objective:  BP (!) 102/55   Pulse 62   Ht 5\' 2"  (1.575 m)   Wt 87 lb (39.5 kg)   BMI 15.91 kg/m   Very low BMI   General Appearance:    Alert, cooperative, no distress, appears stated age  Head:    Normocephalic, without obvious abnormality, atraumatic  Eyes:    conjunctiva/corneas clear, EOM's intact, both eyes  Ears:    Normal external ear canals, both ears  Nose:   Nares normal, septum midline, mucosa normal, no drainage    or sinus tenderness  Throat:   Lips, mucosa, and tongue normal; teeth and gums normal  Neck:   Supple, symmetrical, trachea midline, no adenopathy;    thyroid:  no enlargement/tenderness/nodules  Back:     Symmetric, no curvature, ROM normal, no CVA tenderness  Lungs:     respirations  unlabored  Chest Wall:    No tenderness or deformity   Heart:    Regular rate and rhythm  Breast Exam:    No tenderness, masses, or nipple abnormality  Abdomen:     Soft, non-tender, bowel sounds active all four quadrants,    no masses, no organomegaly  Genitalia:    Normal female without lesion, discharge or tenderness     Extremities:   Extremities normal, atraumatic, no cyanosis or edema  Pulses:   2+ and symmetric all extremities  Skin:   Skin color, texture, turgor normal, no rashes or lesions   Patient given informed consent, she signed consent form. She has has been bleeding heavy since her Nexplanon was placed.  She wants it removed.  Appropriate time out taken.  Patient's left arm was prepped and draped in the usual sterile fashion. Patient was prepped with alcohol swab and then injected with 3 ml of 1 % lidocaine.  She was prepped with betadine.  A #15 blade was used to make a small incision over the Nexplanon rod.  Using curved forceps, the end of the rod was grasped and the scar tissue was removed and the device was removed intact.  There  was minimal blood loss. The incision site was covered with guaze and a pressure bandage to reduce any bruising.  The patient tolerated the procedure well  Assessment:    Healthy female exam.   Contraception management. Nexplanon removed.  Reviewed options. Pt desires OCPs.    Plan:    f/u PAP and cx Pt was given post procedure instructions. Sprinec 1 po q day reviewed use.  F/u in 3 months for OCP check  Coulton Schlink L. Harraway-Smith, M.D., Cherlynn June

## 2019-09-17 NOTE — Progress Notes (Signed)
[employer] interpreter Misa [phone]. Millette Halberstam l Chase Knebel, CMA

## 2019-09-20 LAB — CYTOLOGY - PAP
Chlamydia: NEGATIVE
Comment: NEGATIVE
Comment: NORMAL
Diagnosis: NEGATIVE
Neisseria Gonorrhea: NEGATIVE

## 2020-03-30 ENCOUNTER — Ambulatory Visit: Payer: BC Managed Care – PPO

## 2020-04-18 ENCOUNTER — Encounter: Payer: Self-pay | Admitting: Advanced Practice Midwife

## 2020-04-18 ENCOUNTER — Ambulatory Visit (INDEPENDENT_AMBULATORY_CARE_PROVIDER_SITE_OTHER): Payer: BC Managed Care – PPO | Admitting: Advanced Practice Midwife

## 2020-04-18 ENCOUNTER — Other Ambulatory Visit (HOSPITAL_COMMUNITY)
Admission: RE | Admit: 2020-04-18 | Discharge: 2020-04-18 | Disposition: A | Discharge status: Home / Self Care | Payer: BC Managed Care – PPO | Source: Ambulatory Visit | Attending: Advanced Practice Midwife | Admitting: Advanced Practice Midwife

## 2020-04-18 ENCOUNTER — Other Ambulatory Visit: Payer: Self-pay

## 2020-04-18 VITALS — BP 109/60 | HR 63 | Wt 106.0 lb

## 2020-04-18 DIAGNOSIS — O0930 Supervision of pregnancy with insufficient antenatal care, unspecified trimester: Secondary | ICD-10-CM | POA: Insufficient documentation

## 2020-04-18 DIAGNOSIS — Z789 Other specified health status: Secondary | ICD-10-CM

## 2020-04-18 DIAGNOSIS — O0990 Supervision of high risk pregnancy, unspecified, unspecified trimester: Secondary | ICD-10-CM | POA: Diagnosis present

## 2020-04-18 DIAGNOSIS — Z8751 Personal history of pre-term labor: Secondary | ICD-10-CM

## 2020-04-18 DIAGNOSIS — Z3A29 29 weeks gestation of pregnancy: Secondary | ICD-10-CM | POA: Diagnosis present

## 2020-04-18 DIAGNOSIS — O0993 Supervision of high risk pregnancy, unspecified, third trimester: Secondary | ICD-10-CM

## 2020-04-18 DIAGNOSIS — O09299 Supervision of pregnancy with other poor reproductive or obstetric history, unspecified trimester: Secondary | ICD-10-CM | POA: Insufficient documentation

## 2020-04-18 LAB — FETAL FIBRONECTIN: Fetal Fibronectin: NEGATIVE

## 2020-04-18 NOTE — Progress Notes (Signed)
Subjective:    Josslin Kosanke is a [id number] [redacted]w[redacted]d being seen today for her first obstetrical visit.  Her obstetrical history is significant for History of PTD w Neonatal Death, Hx IUGR, Late prenatal care (conceived on OCPs. Patient does intend to breast feed. Pregnancy history fully reviewed.  Patient reports no complaints.  Vitals:   04/18/20 0904  BP: 109/60  Pulse: 63  Weight: 106 lb (48.1 kg)    HISTORY: OB History  Gravida Para Term Preterm AB Living  5 4 1 3  0 3  SAB TAB Ectopic Multiple Live Births  0 0 0 0 4    # Outcome Date GA Lbr Len/2nd Weight Sex Delivery Anes PTL Lv  5 Current           4 Term 12/26/17 [redacted]w[redacted]d / 00:04 5 lb 12.2 oz (2.614 kg) M Vag-Spont EPI  LIV  3 Preterm 06/02/16    F Vag-Spont  Y LIV  2 Preterm 08/15/15 [redacted]w[redacted]d / 00:15 5 lb 5.7 oz (2.43 kg) M Vag-Spont None  LIV  1 Preterm 03/30/14 [redacted]w[redacted]d   F Vag-Spont None  ND    Obstetric Comments  Delivered infant at 21 weeks but died shortly after birth due to prematurity.   Past Medical History:  Diagnosis Date  . Anemia    Past Surgical History:  Procedure Laterality Date  . NO PAST SURGERIES     Family History  Problem Relation Age of Onset  . Cancer Neg Hx   . Asthma Neg Hx   . Birth defects Neg Hx      Exam    Uterus:  Fundal Height: 26 cm  Pelvic Exam:    Perineum: No Hemorrhoids, Normal Perineum   Vulva: Bartholin's, Urethra, Skene's normal   Vagina:  normal discharge   pH:    Cervix: Cervix 1cm/Long/High,   Contraction palpated   Adnexa: no mass, fullness, tenderness   Bony Pelvis: gynecoid  System: Breast:  normal appearance, no masses or tenderness   Skin: normal coloration and turgor, no rashes    Neurologic: oriented, grossly non-focal   Extremities: normal strength, tone, and muscle mass   HEENT neck supple with midline trachea   Mouth/Teeth mucous membranes moist, pharynx normal without lesions   Neck supple   Cardiovascular: regular rate and rhythm   Respiratory:  appears  well, vitals normal, no respiratory distress, acyanotic, normal RR, ear and throat exam is normal, neck free of mass or lymphadenopathy, chest clear, no wheezing, crepitations, rhonchi, normal symmetric air entry   Abdomen: soft, non-tender; bowel sounds normal; no masses,  no organomegaly   Urinary: urethral meatus normal   Placed on monitor due to palpated UC.  No contractions seen on EFM. FHR reassuring   Assessment:    Pregnancy: [redacted]w[redacted]d Patient Active Problem List   Diagnosis Date Noted  . History of IUGR (intrauterine growth retardation) and stillbirth, currently pregnant 04/18/2020  . Language barrier, speaks 04/20/2020 only 07/20/2015  . History of preterm delivery 04/07/2015        Plan:  Initial labs drawn. Continue prenatal vitamins. Discussed and offered genetic screening options, including Quad screen/AFP, NIPS testing, and option to decline testing. Benefits/risks/alternatives reviewed. Pt aware that anatomy 06/07/2015 is form of genetic screening with lower accuracy in detecting trisomies than blood work.  Pt chooses genetic screening today. NIPS: undecided Ultrasound discussed; fetal anatomic survey: ordered. Problem list reviewed and updated. The nature of [facility] with multiple MDs and other Advanced  Practice Providers was explained to patient; also emphasized that residents, students are part of our team. Routine obstetric precautions reviewed.  Too late for Makena injections Seen at [facility] and treated for BV    Follow up in 2 weeks. 50% of 30 min visit spent on counseling and coordination of care.   Interpretor used   Wynelle Bourgeois 04/18/2020

## 2020-04-18 NOTE — Patient Instructions (Signed)
Third Trimester of Pregnancy  The third trimester is from week 28 through week 40 (months 7 through 9). This trimester is when your unborn baby (fetus) is growing very fast. At the end of the ninth month, the unborn baby is about 20 inches in length. It weighs about 6-10 pounds. Follow these instructions at home: Medicines  Take over-the-counter and prescription medicines only as told by your doctor. Some medicines are safe and some medicines are not safe during pregnancy.  Take a prenatal vitamin that contains at least 600 micrograms (mcg) of folic acid.  If you have trouble pooping (constipation), take medicine that will make your stool soft (stool softener) if your doctor approves. Eating and drinking   Eat regular, healthy meals.  Avoid raw meat and uncooked cheese.  If you get low calcium from the food you eat, talk to your doctor about taking a daily calcium supplement.  Eat four or five small meals rather than three large meals a day.  Avoid foods that are high in fat and sugars, such as fried and sweet foods.  To prevent constipation: ? Eat foods that are high in fiber, like fresh fruits and vegetables, whole grains, and beans. ? Drink enough fluids to keep your pee (urine) clear or pale yellow. Activity  Exercise only as told by your doctor. Stop exercising if you start to have cramps.  Avoid heavy lifting, wear low heels, and sit up straight.  Do not exercise if it is too hot, too humid, or if you are in a place of great height (high altitude).  You may continue to have sex unless your doctor tells you not to. Relieving pain and discomfort  Wear a good support bra if your breasts are tender.  Take frequent breaks and rest with your legs raised if you have leg cramps or low back pain.  Take warm water baths (sitz baths) to soothe pain or discomfort caused by hemorrhoids. Use hemorrhoid cream if your doctor approves.  If you develop puffy, bulging veins (varicose  veins) in your legs: ? Wear support hose or compression stockings as told by your doctor. ? Raise (elevate) your feet for 15 minutes, 3-4 times a day. ? Limit salt in your food. Safety  Wear your seat belt when driving.  Make a list of emergency phone numbers, including numbers for family, friends, the hospital, and police and fire departments. Preparing for your baby's arrival To prepare for the arrival of your baby:  Take prenatal classes.  Practice driving to the hospital.  Visit the hospital and tour the maternity area.  Talk to your work about taking leave once the baby comes.  Pack your hospital bag.  Prepare the baby's room.  Go to your doctor visits.  Buy a rear-facing car seat. Learn how to install it in your car. General instructions  Do not use hot tubs, steam rooms, or saunas.  Do not use any products that contain nicotine or tobacco, such as cigarettes and e-cigarettes. If you need help quitting, ask your doctor.  Do not drink alcohol.  Do not douche or use tampons or scented sanitary pads.  Do not cross your legs for long periods of time.  Do not travel for long distances unless you must. Only do so if your doctor says it is okay.  Visit your dentist if you have not gone during your pregnancy. Use a soft toothbrush to brush your teeth. Be gentle when you floss.  Avoid cat litter boxes and soil   used by cats. These carry germs that can cause birth defects in the baby and can cause a loss of your baby (miscarriage) or stillbirth.  Keep all your prenatal visits as told by your doctor. This is important. Contact a doctor if:  You are not sure if you are in labor or if your water has broken.  You are dizzy.  You have mild cramps or pressure in your lower belly.  You have a nagging pain in your belly area.  You continue to feel sick to your stomach, you throw up, or you have watery poop.  You have bad smelling fluid coming from your vagina.  You have  pain when you pee. Get help right away if:  You have a fever.  You are leaking fluid from your vagina.  You are spotting or bleeding from your vagina.  You have severe belly cramps or pain.  You lose or gain weight quickly.  You have trouble catching your breath and have chest pain.  You notice sudden or extreme puffiness (swelling) of your face, hands, ankles, feet, or legs.  You have not felt the baby move in over an hour.  You have severe headaches that do not go away with medicine.  You have trouble seeing.  You are leaking, or you are having a gush of fluid, from your vagina before you are 37 weeks.  You have regular belly spasms (contractions) before you are 37 weeks. Summary  The third trimester is from week 28 through week 40 (months 7 through 9). This time is when your unborn baby is growing very fast.  Follow your doctor's advice about medicine, food, and activity.  Get ready for the arrival of your baby by taking prenatal classes, getting all the baby items ready, preparing the baby's room, and visiting your doctor to be checked.  Get help right away if you are bleeding from your vagina, or you have chest pain and trouble catching your breath, or if you have not felt your baby move in over an hour. This information is not intended to replace advice given to you by your health care provider. Make sure you discuss any questions you have with your health care provider. Document Revised: 10/08/2018 Document Reviewed: 07/23/2016 Elsevier Patient Education  2020 Elsevier Inc. Preterm Labor and Birth Information Pregnancy normally lasts 39-41 weeks. Preterm labor is when labor starts early. It starts before you have been pregnant for 37 whole weeks. What are the risk factors for preterm labor? Preterm labor is more likely to occur in women who:  Have an infection while pregnant.  Have a cervix that is short.  Have gone into preterm labor before.  Have had surgery  on their cervix.  Are younger than age 17.  Are older than age 35.  Are African American.  Are pregnant with two or more babies.  Take street drugs while pregnant.  Smoke while pregnant.  Do not gain enough weight while pregnant.  Got pregnant right after another pregnancy. What are the symptoms of preterm labor? Symptoms of preterm labor include:  Cramps. The cramps may feel like the cramps some women get during their period. The cramps may happen with watery poop (diarrhea).  Pain in the belly (abdomen).  Pain in the lower back.  Regular contractions or tightening. It may feel like your belly is getting tighter.  Pressure in the lower belly that seems to get stronger.  More fluid (discharge) leaking from the vagina. The fluid may be   watery or bloody.  Water breaking. Why is it important to notice signs of preterm labor? Babies who are born early may not be fully developed. They have a higher chance for:  Long-term heart problems.  Long-term lung problems.  Trouble controlling body systems, like breathing.  Bleeding in the brain.  A condition called cerebral palsy.  Learning difficulties.  Death. These risks are highest for babies who are born before 34 weeks of pregnancy. How is preterm labor treated? Treatment depends on:  How long you were pregnant.  Your condition.  The health of your baby. Treatment may involve:  Having a stitch (suture) placed in your cervix. When you give birth, your cervix opens so the baby can come out. The stitch keeps the cervix from opening too soon.  Staying at the hospital.  Taking or getting medicines, such as: ? Hormone medicines. ? Medicines to stop contractions. ? Medicines to help the baby's lungs develop. ? Medicines to prevent your baby from having cerebral palsy. What should I do if I am in preterm labor? If you think you are going into labor too soon, call your doctor right away. How can I prevent preterm  labor?  Do not use any tobacco products. ? Examples of these are cigarettes, chewing tobacco, and e-cigarettes. ? If you need help quitting, ask your doctor.  Do not use street drugs.  Do not use any medicines unless you ask your doctor if they are safe for you.  Talk with your doctor before taking any herbal supplements.  Make sure you gain enough weight.  Watch for infection. If you think you might have an infection, get it checked right away.  If you have gone into preterm labor before, tell your doctor. This information is not intended to replace advice given to you by your health care provider. Make sure you discuss any questions you have with your health care provider. Document Revised: 10/09/2018 Document Reviewed: 11/08/2015 Elsevier Patient Education  2020 [employer].

## 2020-04-18 NOTE — Progress Notes (Signed)
Late prenatal care. History of preterm deliveries and one PPROM with fetal death at 47 weeks Last pap 2019-10-10 [facility]  Telephonic interpreter used for visit [phone]

## 2020-04-19 LAB — GC/CHLAMYDIA PROBE AMP (~~LOC~~) NOT AT ARMC
Chlamydia: NEGATIVE
Comment: NEGATIVE
Comment: NORMAL
Neisseria Gonorrhea: NEGATIVE

## 2020-04-20 LAB — URINE CULTURE: Organism ID, Bacteria: NO GROWTH

## 2020-04-27 ENCOUNTER — Other Ambulatory Visit: Payer: Self-pay | Admitting: *Deleted

## 2020-04-27 ENCOUNTER — Ambulatory Visit: Payer: BC Managed Care – PPO | Attending: Advanced Practice Midwife

## 2020-04-27 ENCOUNTER — Other Ambulatory Visit: Payer: Self-pay

## 2020-04-27 ENCOUNTER — Ambulatory Visit: Payer: BC Managed Care – PPO | Admitting: *Deleted

## 2020-04-27 ENCOUNTER — Encounter: Payer: Self-pay | Admitting: *Deleted

## 2020-04-27 DIAGNOSIS — O0930 Supervision of pregnancy with insufficient antenatal care, unspecified trimester: Secondary | ICD-10-CM

## 2020-04-27 DIAGNOSIS — O0990 Supervision of high risk pregnancy, unspecified, unspecified trimester: Secondary | ICD-10-CM

## 2020-04-27 DIAGNOSIS — O0933 Supervision of pregnancy with insufficient antenatal care, third trimester: Secondary | ICD-10-CM | POA: Diagnosis not present

## 2020-04-27 DIAGNOSIS — Z3687 Encounter for antenatal screening for uncertain dates: Secondary | ICD-10-CM | POA: Diagnosis not present

## 2020-04-27 DIAGNOSIS — Z363 Encounter for antenatal screening for malformations: Secondary | ICD-10-CM | POA: Insufficient documentation

## 2020-04-27 DIAGNOSIS — O09213 Supervision of pregnancy with history of pre-term labor, third trimester: Secondary | ICD-10-CM | POA: Insufficient documentation

## 2020-04-27 DIAGNOSIS — Z3A30 30 weeks gestation of pregnancy: Secondary | ICD-10-CM | POA: Diagnosis not present

## 2020-04-27 DIAGNOSIS — O09293 Supervision of pregnancy with other poor reproductive or obstetric history, third trimester: Secondary | ICD-10-CM | POA: Insufficient documentation

## 2020-04-27 DIAGNOSIS — Z3A29 29 weeks gestation of pregnancy: Secondary | ICD-10-CM | POA: Diagnosis not present

## 2020-04-27 DIAGNOSIS — Z8751 Personal history of pre-term labor: Secondary | ICD-10-CM

## 2020-04-27 DIAGNOSIS — O09299 Supervision of pregnancy with other poor reproductive or obstetric history, unspecified trimester: Secondary | ICD-10-CM

## 2020-04-27 DIAGNOSIS — Z3493 Encounter for supervision of normal pregnancy, unspecified, third trimester: Secondary | ICD-10-CM

## 2020-05-02 ENCOUNTER — Encounter: Payer: BC Managed Care – PPO | Admitting: Advanced Practice Midwife

## 2020-05-09 ENCOUNTER — Encounter: Payer: Self-pay | Admitting: Advanced Practice Midwife

## 2020-05-09 ENCOUNTER — Ambulatory Visit (INDEPENDENT_AMBULATORY_CARE_PROVIDER_SITE_OTHER): Payer: Medicaid Other | Admitting: Advanced Practice Midwife

## 2020-05-09 ENCOUNTER — Other Ambulatory Visit: Payer: Self-pay

## 2020-05-09 VITALS — BP 110/53 | HR 62 | Wt 107.0 lb

## 2020-05-09 DIAGNOSIS — N898 Other specified noninflammatory disorders of vagina: Secondary | ICD-10-CM

## 2020-05-09 DIAGNOSIS — B3731 Acute candidiasis of vulva and vagina: Secondary | ICD-10-CM | POA: Insufficient documentation

## 2020-05-09 DIAGNOSIS — O0933 Supervision of pregnancy with insufficient antenatal care, third trimester: Secondary | ICD-10-CM

## 2020-05-09 DIAGNOSIS — Z3A32 32 weeks gestation of pregnancy: Secondary | ICD-10-CM

## 2020-05-09 DIAGNOSIS — B373 Candidiasis of vulva and vagina: Secondary | ICD-10-CM

## 2020-05-09 DIAGNOSIS — O09299 Supervision of pregnancy with other poor reproductive or obstetric history, unspecified trimester: Secondary | ICD-10-CM

## 2020-05-09 MED ORDER — TERCONAZOLE 0.4 % VA CREA: 1.0000 | TOPICAL_CREAM | Freq: Every day | VAGINAL | 0 refills | Status: DC

## 2020-05-09 NOTE — Patient Instructions (Signed)

## 2020-05-09 NOTE — Progress Notes (Signed)
   PRENATAL VISIT NOTE  Subjective:  Sylvia Bullock is a 24 y.o. [id number] at [redacted]w[redacted]d being seen today for ongoing prenatal care.  She is currently monitored for the following issues for this high-risk pregnancy and has History of preterm delivery; Supervision of high risk pregnancy, antepartum; Language barrier, speaks [name] only; and History of IUGR (intrauterine growth retardation) and stillbirth, currently pregnant on their problem list.  Patient reports white discharge with itching.  Contractions: Not present. Vag. Bleeding: None.  Movement: Present. Denies leaking of fluid.   The following portions of the patient's history were reviewed and updated as appropriate: allergies, current medications, past family history, past medical history, past social history, past surgical history and problem list.   Objective:   Vitals:   05/09/20 1017  BP: (!) 110/53  Pulse: 62  Weight: 107 lb (48.5 kg)    Fetal Status: Fetal Heart Rate (bpm): 135   Movement: Present     General:  Alert, oriented and cooperative. Patient is in no acute distress.  Skin: Skin is warm and dry. No rash noted.   Cardiovascular: Normal heart rate noted  Respiratory: Normal respiratory effort, no problems with respiration noted  Abdomen: Soft, gravid, appropriate for gestational age.  Pain/Pressure: Absent    Fundal height is smaller than expected.  Pelvic: Cervical exam deferred        Extremities: Normal range of motion.  Edema: None  Mental Status: Normal mood and affect. Normal behavior. Normal judgment and thought content.   Assessment and Plan:  Pregnancy: [id number] at [redacted]w[redacted]d 1. History of IUGR (intrauterine growth retardation) and stillbirth, currently pregnant     Fundal height continues to be smaller than dates     Ordered US to be done this week instead of 23rd     States has good consistent movement - Glucose Tolerance, 2 Hours w/1 Hour  2. [redacted] weeks gestation of pregnancy    Never went for new ob labs, will add  those onto GTT this week - Glucose Tolerance, 2 Hours w/1 Hour  3. Late prenatal care affecting pregnancy in third trimester      - Glucose Tolerance, 2 Hours w/1 Hour  4. Small for gestational age     See prob #1     Next visit with MD - Glucose Tolerance, 2 Hours w/1 Hour  5. Vaginal itching     Probably yeast vaginitis - terconazole (TERAZOL 7) 0.4 % vaginal cream; Place 1 applicator vaginally at bedtime.  Dispense: 45 g; Refill: 0  Preterm labor symptoms and general obstetric precautions including but not limited to vaginal bleeding, contractions, leaking of fluid and fetal movement were reviewed in detail with the patient. Please refer to After Visit Summary for other counseling recommendations.     Future Appointments  Date Time Provider Department Center  05/11/2020  7:15 AM [facility] NURSE [facility]  05/11/2020  7:30 AM [facility] US3 [facility]  05/12/2020  8:25 AM [facility] NURSE [facility] None    Wynelle Bourgeois, CNM

## 2020-05-09 NOTE — Progress Notes (Signed)
Video remote Interpreter # [id number] used for this visit. Armandina Stammer RN

## 2020-05-11 ENCOUNTER — Ambulatory Visit: Payer: Medicaid Other | Attending: Obstetrics and Gynecology

## 2020-05-11 ENCOUNTER — Ambulatory Visit: Payer: Medicaid Other

## 2020-05-12 ENCOUNTER — Other Ambulatory Visit: Payer: Self-pay

## 2020-05-12 ENCOUNTER — Ambulatory Visit (INDEPENDENT_AMBULATORY_CARE_PROVIDER_SITE_OTHER): Payer: Medicaid Other

## 2020-05-12 DIAGNOSIS — O0990 Supervision of high risk pregnancy, unspecified, unspecified trimester: Secondary | ICD-10-CM

## 2020-05-12 DIAGNOSIS — Z3A33 33 weeks gestation of pregnancy: Secondary | ICD-10-CM

## 2020-05-12 NOTE — Progress Notes (Signed)
Pt presents for 2 hr GTT. Pt was sent to lab for GTT.  Merik Mignano l Andrian Sabala, CMA

## 2020-05-12 NOTE — Progress Notes (Signed)
Chart reviewed - agree with CMA/RN documentation.  ° °

## 2020-05-13 LAB — CBC/D/PLT+RPR+RH+ABO+RUB AB...
Antibody Screen: NEGATIVE
Basophils Absolute: 0 10*3/uL (ref 0.0–0.2)
Basos: 0 %
EOS (ABSOLUTE): 0.1 10*3/uL (ref 0.0–0.4)
Eos: 1 %
HCV Ab: <0.1 s/co ratio (ref 0.0–0.9)
HIV Screen 4th Generation wRfx: NONREACTIVE
Hematocrit: 31.2 % — ABNORMAL LOW (ref 34.0–46.6)
Hemoglobin: 8.9 g/dL — ABNORMAL LOW (ref 11.1–15.9)
Hepatitis B Surface Ag: NEGATIVE
Immature Grans (Abs): 0 10*3/uL (ref 0.0–0.1)
Immature Granulocytes: 1 %
Lymphocytes Absolute: 1.4 10*3/uL (ref 0.7–3.1)
Lymphs: 22 %
MCH: 19.7 pg — ABNORMAL LOW (ref 26.6–33.0)
MCHC: 28.5 g/dL — ABNORMAL LOW (ref 31.5–35.7)
MCV: 69 fL — ABNORMAL LOW (ref 79–97)
Monocytes Absolute: 0.6 10*3/uL (ref 0.1–0.9)
Monocytes: 9 %
Neutrophils Absolute: 4.4 10*3/uL (ref 1.4–7.0)
Neutrophils: 67 %
Platelets: 323 10*3/uL (ref 150–450)
RBC: 4.51 x10E6/uL (ref 3.77–5.28)
RDW: 19.1 % — ABNORMAL HIGH (ref 11.7–15.4)
RPR Ser Ql: NONREACTIVE
Rh Factor: POSITIVE
Rubella Antibodies, IGG: 11.3 index (ref >0.99)
WBC: 6.6 10*3/uL (ref 3.4–10.8)

## 2020-05-13 LAB — HCV INTERPRETATION

## 2020-05-13 LAB — GLUCOSE TOLERANCE, 2 HOURS W/ 1HR
Glucose, 1 hour: 103 mg/dL (ref 65–179)
Glucose, 2 hour: 93 mg/dL (ref 65–152)
Glucose, Fasting: 70 mg/dL (ref 65–91)

## 2020-05-23 ENCOUNTER — Ambulatory Visit: Payer: Medicaid Other

## 2020-05-23 ENCOUNTER — Encounter: Payer: BC Managed Care – PPO | Admitting: Advanced Practice Midwife

## 2020-05-24 ENCOUNTER — Ambulatory Visit: Payer: Medicaid Other | Admitting: *Deleted

## 2020-05-24 ENCOUNTER — Telehealth: Payer: Self-pay

## 2020-05-24 ENCOUNTER — Encounter: Payer: Self-pay | Admitting: *Deleted

## 2020-05-24 ENCOUNTER — Ambulatory Visit: Payer: Medicaid Other | Attending: Obstetrics

## 2020-05-24 ENCOUNTER — Ambulatory Visit (INDEPENDENT_AMBULATORY_CARE_PROVIDER_SITE_OTHER): Payer: Medicaid Other | Admitting: Family Medicine

## 2020-05-24 ENCOUNTER — Other Ambulatory Visit: Payer: Self-pay

## 2020-05-24 VITALS — BP 111/67 | HR 68 | Wt 106.0 lb

## 2020-05-24 DIAGNOSIS — B373 Candidiasis of vulva and vagina: Secondary | ICD-10-CM

## 2020-05-24 DIAGNOSIS — Z789 Other specified health status: Secondary | ICD-10-CM

## 2020-05-24 DIAGNOSIS — B3731 Acute candidiasis of vulva and vagina: Secondary | ICD-10-CM

## 2020-05-24 DIAGNOSIS — O0990 Supervision of high risk pregnancy, unspecified, unspecified trimester: Secondary | ICD-10-CM

## 2020-05-24 DIAGNOSIS — O09299 Supervision of pregnancy with other poor reproductive or obstetric history, unspecified trimester: Secondary | ICD-10-CM | POA: Insufficient documentation

## 2020-05-24 DIAGNOSIS — Z8751 Personal history of pre-term labor: Secondary | ICD-10-CM

## 2020-05-24 DIAGNOSIS — Z3493 Encounter for supervision of normal pregnancy, unspecified, third trimester: Secondary | ICD-10-CM

## 2020-05-24 DIAGNOSIS — D509 Iron deficiency anemia, unspecified: Secondary | ICD-10-CM

## 2020-05-24 DIAGNOSIS — O99013 Anemia complicating pregnancy, third trimester: Secondary | ICD-10-CM

## 2020-05-24 MED ORDER — FERROUS SULFATE 325 (65 FE) MG PO TABS: 325.0000 mg | ORAL_TABLET | ORAL | 1 refills | Status: DC

## 2020-05-24 MED ORDER — CITRANATAL BLOOM 90-1 MG PO TABS: 1.0000 | ORAL_TABLET | Freq: Every day | ORAL | 2 refills | Status: DC

## 2020-05-24 NOTE — Progress Notes (Signed)
° °  PRENATAL VISIT NOTE  Subjective:  Sylvia Bullock is a 24 y.o. [id number] at [redacted]w[redacted]d being seen today for ongoing prenatal care.  She is currently monitored for the following issues for this low-risk pregnancy and has History of preterm delivery; Supervision of high risk pregnancy, antepartum; Language barrier, speaks [name] only; Late prenatal care affecting pregnancy; History of IUGR (intrauterine growth retardation) and stillbirth, currently pregnant; SGA (small for gestational age); and Yeast vaginitis on their problem list.  Patient reports dizziness and night sweats, has difficulty finding food.  Contractions: Not present. Vag. Bleeding: None.  Movement: Present. Denies leaking of fluid.   The following portions of the patient's history were reviewed and updated as appropriate: allergies, current medications, past family history, past medical history, past social history, past surgical history and problem list.   Objective:   Vitals:   05/24/20 0904  BP: 111/67  Pulse: 68  Weight: 106 lb (48.1 kg)    Fetal Status: Fetal Heart Rate (bpm): 125 Fundal Height: 28 cm Movement: Present     General:  Alert, oriented and cooperative. Patient is in no acute distress.  Skin: Skin is warm and dry. No rash noted.   Cardiovascular: Normal heart rate noted  Respiratory: Normal respiratory effort, no problems with respiration noted  Abdomen: Soft, gravid, appropriate for gestational age.  Pain/Pressure: Absent     Pelvic: Cervical exam deferred        Extremities: Normal range of motion.  Edema: None  Mental Status: Normal mood and affect. Normal behavior. Normal judgment and thought content.   Assessment and Plan:  Pregnancy: M0N0272 at [redacted]w[redacted]d 1. SGA (small for gestational age) U/s today  2. Yeast vaginitis   3. History of IUGR (intrauterine growth retardation) and stillbirth, currently pregnant For u/s today  4. History of preterm delivery   5. Language barrier, speaks [name] only Video  interpreter used  6. Supervision of high risk pregnancy, antepartum - Prenatal-DSS-FeCb-FeGl-FA (CITRANATAL BLOOM) 90-1 MG TABS; Take 1 tablet by mouth daily. (Patient not taking: Reported on 05/24/2020)  Dispense: 90 tablet; Refill: 2  7. Maternal iron deficiency anemia affecting pregnancy in third trimester, antepartum hgb 8.9--has dizziness and night sweats Denies cough, fever, weight loss. Consider IV infusion. - ferrous sulfate (FERROUSUL) 325 (65 FE) MG tablet; Take 1 tablet (325 mg total) by mouth every other day. (Patient not taking: Reported on 05/24/2020)  Dispense: 60 tablet; Refill: 1  Preterm labor symptoms and general obstetric precautions including but not limited to vaginal bleeding, contractions, leaking of fluid and fetal movement were reviewed in detail with the patient. Please refer to After Visit Summary for other counseling recommendations.   Return in 2 weeks (on 06/07/2020) for [facility], in person.  Future Appointments  Date Time Provider Department Center  06/09/2020 10:00 AM Levie Heritage, DO [facility] None    Reva Bores, MD

## 2020-05-24 NOTE — Progress Notes (Signed)
[name] interpreter [phone] used for visit. Armandina Stammer RN

## 2020-05-24 NOTE — Telephone Encounter (Signed)
Escorted patient and patient's spouse to first floor to meet with Ant[employer]ette about food bank product.

## 2020-05-24 NOTE — Patient Instructions (Signed)

## 2020-06-09 ENCOUNTER — Other Ambulatory Visit: Payer: Self-pay

## 2020-06-09 ENCOUNTER — Ambulatory Visit (INDEPENDENT_AMBULATORY_CARE_PROVIDER_SITE_OTHER): Payer: Medicaid Other | Admitting: Family Medicine

## 2020-06-09 ENCOUNTER — Encounter: Payer: Medicaid Other | Admitting: Family Medicine

## 2020-06-09 ENCOUNTER — Encounter: Payer: Self-pay | Admitting: Family Medicine

## 2020-06-09 ENCOUNTER — Other Ambulatory Visit (HOSPITAL_COMMUNITY)
Admission: RE | Admit: 2020-06-09 | Discharge: 2020-06-09 | Disposition: A | Discharge status: Home / Self Care | Payer: Medicaid Other | Source: Ambulatory Visit | Attending: Family Medicine | Admitting: Family Medicine

## 2020-06-09 VITALS — BP 106/71 | HR 72 | Wt 107.0 lb

## 2020-06-09 DIAGNOSIS — Z3A37 37 weeks gestation of pregnancy: Secondary | ICD-10-CM

## 2020-06-09 DIAGNOSIS — D509 Iron deficiency anemia, unspecified: Secondary | ICD-10-CM

## 2020-06-09 DIAGNOSIS — O0990 Supervision of high risk pregnancy, unspecified, unspecified trimester: Secondary | ICD-10-CM | POA: Insufficient documentation

## 2020-06-09 DIAGNOSIS — O0993 Supervision of high risk pregnancy, unspecified, third trimester: Secondary | ICD-10-CM | POA: Diagnosis not present

## 2020-06-09 DIAGNOSIS — O99013 Anemia complicating pregnancy, third trimester: Secondary | ICD-10-CM | POA: Diagnosis not present

## 2020-06-09 DIAGNOSIS — O09293 Supervision of pregnancy with other poor reproductive or obstetric history, third trimester: Secondary | ICD-10-CM | POA: Diagnosis not present

## 2020-06-09 DIAGNOSIS — Z789 Other specified health status: Secondary | ICD-10-CM

## 2020-06-09 DIAGNOSIS — O99019 Anemia complicating pregnancy, unspecified trimester: Secondary | ICD-10-CM

## 2020-06-09 DIAGNOSIS — O09299 Supervision of pregnancy with other poor reproductive or obstetric history, unspecified trimester: Secondary | ICD-10-CM

## 2020-06-09 NOTE — Progress Notes (Signed)
   PRENATAL VISIT NOTE  Subjective:  Sylvia Bullock is a 24 y.o. [id number] at [redacted]w[redacted]d being seen today for ongoing prenatal care.  She is currently monitored for the following issues for this high-risk pregnancy and has History of preterm delivery; Supervision of high risk pregnancy, antepartum; Language barrier, speaks [name] only; Late prenatal care affecting pregnancy; History of IUGR (intrauterine growth retardation) and stillbirth, currently pregnant; SGA (small for gestational age); Yeast vaginitis; and Iron deficiency anemia of pregnancy on their problem list.  Patient reports no complaints.  Contractions: Not present. Vag. Bleeding: None.  Movement: Present. Denies leaking of fluid.   The following portions of the patient's history were reviewed and updated as appropriate: allergies, current medications, past family history, past medical history, past social history, past surgical history and problem list.   Objective:   Vitals:   06/09/20 1023  BP: 106/71  Pulse: 72  Weight: 107 lb (48.5 kg)    Fetal Status: Fetal Heart Rate (bpm): 131 Fundal Height: 33 cm Movement: Present  Presentation: Vertex  General:  Alert, oriented and cooperative. Patient is in no acute distress.  Skin: Skin is warm and dry. No rash noted.   Cardiovascular: Normal heart rate noted  Respiratory: Normal respiratory effort, no problems with respiration noted  Abdomen: Soft, gravid, appropriate for gestational age.  Pain/Pressure: Absent     Pelvic: Cervical exam performed in the presence of a chaperone Dilation: 3.5 Effacement (%): 70 Station: -2  Extremities: Normal range of motion.  Edema: None  Mental Status: Normal mood and affect. Normal behavior. Normal judgment and thought content.   Assessment and Plan:  Pregnancy: G5P1303 at [redacted]w[redacted]d 1. [redacted] weeks gestation of pregnancy  2. Supervision of high risk pregnancy, antepartum FHT normal.  3. Iron deficiency anemia of pregnancy Oral iron makes her sick. Will  schedule IV iron  4. SGA (small for gestational age) US shows EFW 18%  5. History of IUGR (intrauterine growth retardation) and stillbirth, currently pregnant  6. Language barrier, speaks [name] only Interpreter used  Term labor symptoms and general obstetric precautions including but not limited to vaginal bleeding, contractions, leaking of fluid and fetal movement were reviewed in detail with the patient. Please refer to After Visit Summary for other counseling recommendations.   Return in about 1 week (around 06/16/2020) for OB f/u.  Future Appointments  Date Time Provider Department Center  06/15/2020  8:30 AM Levie Heritage, DO [facility] None    Levie Heritage, DO

## 2020-06-09 NOTE — Progress Notes (Signed)
[employer] language services interpreter Tabe [phone].

## 2020-06-10 ENCOUNTER — Encounter (HOSPITAL_COMMUNITY): Payer: Self-pay | Admitting: Obstetrics and Gynecology

## 2020-06-10 ENCOUNTER — Inpatient Hospital Stay (HOSPITAL_COMMUNITY)
Admission: AD | Admit: 2020-06-10 | Discharge: 2020-06-12 | DRG: 807 | Disposition: A | Discharge status: Home / Self Care | Payer: Medicaid Other | Attending: Obstetrics and Gynecology | Admitting: Obstetrics and Gynecology

## 2020-06-10 DIAGNOSIS — Z789 Other specified health status: Secondary | ICD-10-CM | POA: Diagnosis present

## 2020-06-10 DIAGNOSIS — B3731 Acute candidiasis of vulva and vagina: Secondary | ICD-10-CM

## 2020-06-10 DIAGNOSIS — Z20822 Contact with and (suspected) exposure to covid-19: Secondary | ICD-10-CM | POA: Diagnosis present

## 2020-06-10 DIAGNOSIS — O09299 Supervision of pregnancy with other poor reproductive or obstetric history, unspecified trimester: Secondary | ICD-10-CM

## 2020-06-10 DIAGNOSIS — O365930 Maternal care for other known or suspected poor fetal growth, third trimester, not applicable or unspecified: Principal | ICD-10-CM | POA: Diagnosis present

## 2020-06-10 DIAGNOSIS — O26893 Other specified pregnancy related conditions, third trimester: Secondary | ICD-10-CM | POA: Diagnosis present

## 2020-06-10 DIAGNOSIS — O9903 Anemia complicating the puerperium: Secondary | ICD-10-CM | POA: Diagnosis not present

## 2020-06-10 DIAGNOSIS — Z9889 Other specified postprocedural states: Secondary | ICD-10-CM | POA: Diagnosis not present

## 2020-06-10 DIAGNOSIS — O9902 Anemia complicating childbirth: Secondary | ICD-10-CM | POA: Diagnosis present

## 2020-06-10 DIAGNOSIS — O99019 Anemia complicating pregnancy, unspecified trimester: Secondary | ICD-10-CM

## 2020-06-10 DIAGNOSIS — Z3A37 37 weeks gestation of pregnancy: Secondary | ICD-10-CM

## 2020-06-10 DIAGNOSIS — D649 Anemia, unspecified: Secondary | ICD-10-CM | POA: Diagnosis not present

## 2020-06-10 DIAGNOSIS — D509 Iron deficiency anemia, unspecified: Secondary | ICD-10-CM

## 2020-06-10 DIAGNOSIS — O0930 Supervision of pregnancy with insufficient antenatal care, unspecified trimester: Secondary | ICD-10-CM

## 2020-06-10 LAB — CBC
HCT: 30.9 % — ABNORMAL LOW (ref 36.0–46.0)
Hemoglobin: 8.9 g/dL — ABNORMAL LOW (ref 12.0–15.0)
MCH: 20.5 pg — ABNORMAL LOW (ref 26.0–34.0)
MCHC: 28.8 g/dL — ABNORMAL LOW (ref 30.0–36.0)
MCV: 71 fL — ABNORMAL LOW (ref 80.0–100.0)
Platelets: 330 10*3/uL (ref 150–400)
RBC: 4.35 MIL/uL (ref 3.87–5.11)
RDW: 23.1 % — ABNORMAL HIGH (ref 11.5–15.5)
WBC: 8.9 10*3/uL (ref 4.0–10.5)
nRBC: 0 % (ref 0.0–0.2)

## 2020-06-10 LAB — RESP PANEL BY RT-PCR (FLU A&B, COVID) ARPGX2
Influenza A by PCR: NEGATIVE
Influenza B by PCR: NEGATIVE
SARS Coronavirus 2 by RT PCR: NEGATIVE

## 2020-06-10 LAB — RPR: RPR Ser Ql: NONREACTIVE

## 2020-06-10 MED ORDER — ONDANSETRON HCL 4 MG/2ML IJ SOLN: 4.0000 mg | INTRAMUSCULAR | Status: DC | PRN

## 2020-06-10 MED ORDER — DIPHENHYDRAMINE HCL 25 MG PO CAPS: 25.0000 mg | ORAL_CAPSULE | Freq: Four times a day (QID) | ORAL | Status: DC | PRN

## 2020-06-10 MED ORDER — BENZOCAINE-MENTHOL 20-0.5 % EX AERO
1.0000 "application " | INHALATION_SPRAY | CUTANEOUS | Status: DC | PRN
  Administered 2020-06-10: 1 via TOPICAL
  Filled 2020-06-10: qty 56

## 2020-06-10 MED ORDER — DIPHENHYDRAMINE HCL 12.5 MG/5ML PO ELIX
25.0000 mg | ORAL_SOLUTION | Freq: Four times a day (QID) | ORAL | Status: DC | PRN
  Filled 2020-06-10 (×2): qty 10

## 2020-06-10 MED ORDER — ACETAMINOPHEN 325 MG PO TABS: 650.0000 mg | ORAL_TABLET | ORAL | Status: DC | PRN

## 2020-06-10 MED ORDER — LACTATED RINGERS IV SOLN: 500.0000 mL | INTRAVENOUS | Status: DC | PRN

## 2020-06-10 MED ORDER — MISOPROSTOL 200 MCG PO TABS
ORAL_TABLET | ORAL | Status: AC
  Filled 2020-06-10: qty 5

## 2020-06-10 MED ORDER — MISOPROSTOL 200 MCG PO TABS
800.0000 ug | ORAL_TABLET | Freq: Once | ORAL | Status: AC
  Administered 2020-06-10: 08:00:00 800 ug via RECTAL

## 2020-06-10 MED ORDER — ONDANSETRON HCL 4 MG PO TABS: 4.0000 mg | ORAL_TABLET | ORAL | Status: DC | PRN

## 2020-06-10 MED ORDER — SOD CITRATE-CITRIC ACID 500-334 MG/5ML PO SOLN: 30.0000 mL | ORAL | Status: DC | PRN

## 2020-06-10 MED ORDER — OXYTOCIN BOLUS FROM INFUSION
333.0000 mL | Freq: Once | INTRAVENOUS | Status: AC
  Administered 2020-06-10: 10:00:00 333 mL via INTRAVENOUS

## 2020-06-10 MED ORDER — IBUPROFEN 100 MG/5ML PO SUSP
600.0000 mg | Freq: Four times a day (QID) | ORAL | Status: DC
  Administered 2020-06-10 – 2020-06-12 (×9): 600 mg via ORAL
  Filled 2020-06-10 (×9): qty 30

## 2020-06-10 MED ORDER — SIMETHICONE 80 MG PO CHEW: 80.0000 mg | CHEWABLE_TABLET | ORAL | Status: DC | PRN

## 2020-06-10 MED ORDER — DOCUSATE SODIUM 50 MG/5ML PO LIQD
100.0000 mg | Freq: Two times a day (BID) | ORAL | Status: DC
  Administered 2020-06-11: 01:00:00 100 mg via ORAL
  Filled 2020-06-10 (×6): qty 10

## 2020-06-10 MED ORDER — COCONUT OIL OIL: 1.0000 "application " | TOPICAL_OIL | Status: DC | PRN

## 2020-06-10 MED ORDER — LACTATED RINGERS IV SOLN: INTRAVENOUS | Status: DC

## 2020-06-10 MED ORDER — OXYTOCIN-SODIUM CHLORIDE 30-0.9 UT/500ML-% IV SOLN
2.5000 [IU]/h | INTRAVENOUS | Status: DC
  Filled 2020-06-10: qty 500

## 2020-06-10 MED ORDER — COMPLETENATE 29-1 MG PO CHEW
1.0000 | CHEWABLE_TABLET | Freq: Every day | ORAL | Status: DC
  Administered 2020-06-10 – 2020-06-12 (×3): 1 via ORAL
  Filled 2020-06-10 (×3): qty 1

## 2020-06-10 MED ORDER — IBUPROFEN 600 MG PO TABS: 600.0000 mg | ORAL_TABLET | Freq: Four times a day (QID) | ORAL | Status: DC

## 2020-06-10 MED ORDER — ACETAMINOPHEN 160 MG/5ML PO SOLN
650.0000 mg | Freq: Four times a day (QID) | ORAL | Status: DC | PRN
  Administered 2020-06-10 – 2020-06-11 (×2): 650 mg via ORAL
  Filled 2020-06-10 (×2): qty 20.3

## 2020-06-10 MED ORDER — WITCH HAZEL-GLYCERIN EX PADS: 1.0000 "application " | MEDICATED_PAD | CUTANEOUS | Status: DC | PRN

## 2020-06-10 MED ORDER — OXYTOCIN 10 UNIT/ML IJ SOLN
INTRAMUSCULAR | Status: AC
  Administered 2020-06-10: 08:00:00 10 [IU] via INTRAMUSCULAR
  Filled 2020-06-10: qty 1

## 2020-06-10 MED ORDER — TRANEXAMIC ACID-NACL 1000-0.7 MG/100ML-% IV SOLN
INTRAVENOUS | Status: AC
  Filled 2020-06-10: qty 100

## 2020-06-10 MED ORDER — SODIUM CHLORIDE 0.9 % IV SOLN
400.0000 mg | INTRAVENOUS | Status: DC
  Administered 2020-06-10: 14:00:00 400 mg via INTRAVENOUS
  Filled 2020-06-10: qty 20

## 2020-06-10 MED ORDER — TETANUS-DIPHTH-ACELL PERTUSSIS 5-2.5-18.5 LF-MCG/0.5 IM SUSY: 0.5000 mL | PREFILLED_SYRINGE | Freq: Once | INTRAMUSCULAR | Status: DC

## 2020-06-10 MED ORDER — DIBUCAINE (PERIANAL) 1 % EX OINT: 1.0000 "application " | TOPICAL_OINTMENT | CUTANEOUS | Status: DC | PRN

## 2020-06-10 MED ORDER — LIDOCAINE HCL (PF) 1 % IJ SOLN: 30.0000 mL | INTRAMUSCULAR | Status: DC | PRN

## 2020-06-10 MED ORDER — ONDANSETRON HCL 4 MG/2ML IJ SOLN: 4.0000 mg | Freq: Four times a day (QID) | INTRAMUSCULAR | Status: DC | PRN

## 2020-06-10 MED ORDER — PRENATAL MULTIVITAMIN CH: 1.0000 | ORAL_TABLET | Freq: Every day | ORAL | Status: DC

## 2020-06-10 MED ORDER — SENNOSIDES-DOCUSATE SODIUM 8.6-50 MG PO TABS
2.0000 | ORAL_TABLET | ORAL | Status: DC
  Administered 2020-06-12: 11:00:00 2 via ORAL
  Filled 2020-06-10: qty 2

## 2020-06-10 MED ORDER — DOCUSATE SODIUM 100 MG PO CAPS: 100.0000 mg | ORAL_CAPSULE | Freq: Two times a day (BID) | ORAL | Status: DC

## 2020-06-10 NOTE — H&P (Signed)
OBSTETRIC ADMISSION HISTORY AND PHYSICAL  Sylvia Bullock is a 24 y.o. female [id number] with IUP at [redacted]w[redacted]d by 3rd trimester [place] here for labor. Patient arrived at 10 cm and so limited HPI obtained on admission. Reports contractions started at 3 am. Denies vaginal bleeding, headaches, RUQ pain.    She plans on formula feeding. She is unsure for birth control. [place] prenatal [place]   Dating: By Third Trimester US--->  Estimated Date of Delivery: 06/30/20  Sono:    @[redacted]w[redacted]d , CWD, normal anatomy, cephalic presentation, 2238g, 07-18-1995 EFW   Prenatal History/Complications:  -language barrier -history of IUGR pregnancies -iron deficiency anemia on iron  -late to prenatal care (29 weeks)   Past Medical History: Past Medical History:  Diagnosis Date  . Anemia     Past Surgical History: Past Surgical History:  Procedure Laterality Date  . NO PAST SURGERIES      Obstetrical History: OB History    Gravida  5   Para  4   Term  1   Preterm  3   AB  0   Living  3     SAB  0   IAB  0   Ectopic  0   Multiple  0   Live Births  4        Obstetric Comments  Delivered infant at 21 weeks but died shortly after birth due to prematurity.        Social History Social History   Socioeconomic History  . Marital status: Married    Spouse name: Not on file  . Number of children: Not on file  . Years of education: Not on file  . Highest education level: Not on file  Occupational History  . Not on file  Tobacco Use  . Smoking status: Never Smoker  . Smokeless tobacco: Never Used  Vaping Use  . Vaping Use: Never used  Substance and Sexual Activity  . Alcohol use: No  . Drug use: No  . Sexual activity: Yes    Birth control/protection: None  Other Topics Concern  . Not on file  Social History Narrative  . Not on file   Social Determinants of Health   Financial Resource Strain: Not on file  Food Insecurity: Not on file  Transportation Needs: Not on  file  Physical Activity: Not on file  Stress: Not on file  Social Connections: Not on file    Family History: Family History  Problem Relation Age of Onset  . Cancer Neg Hx   . Asthma Neg Hx   . Birth defects Neg Hx     Allergies: No Known Allergies  Medications Prior to Admission  Medication Sig Dispense Refill Last Dose  . feeding supplement, ENSURE COMPLETE, (ENSURE COMPLETE) LIQD Take 237 mLs by mouth 2 (two) times daily between meals. (Patient not taking: No sig reported) 60 Bottle 3   . ferrous sulfate (FERROUSUL) 325 (65 FE) MG tablet Take 1 tablet (325 mg total) by mouth every other day. (Patient not taking: No sig reported) 60 tablet 1   . metroNIDAZOLE (METROGEL) 0.75 % vaginal gel Place vaginally at bedtime. (Patient not taking: No sig reported)     . Prenatal-DSS-FeCb-FeGl-FA (CITRANATAL BLOOM) 90-1 MG TABS Take 1 tablet by mouth daily. (Patient not taking: No sig reported) 90 tablet 2   . terconazole (TERAZOL 7) 0.4 % vaginal cream Place 1 applicator vaginally at bedtime. 45 g 0      Review of Systems  All systems reviewed and negative except as stated in HPI  Blood pressure 112/71, pulse (!) 56, temperature 97.8 F (36.6 C), temperature source Oral, resp. rate 16, currently breastfeeding. General appearance: alert, cooperative and appears stated age Lungs: clear to auscultation bilaterally Heart: regular rate and rhythm Abdomen: soft, non-tender; bowel sounds normal Extremities: Homans sign is negative, no sign of DVT  Presentation: cephalic Fetal monitoring baseline 130, mod variability, pos accels, no decels  Uterine activity q2 min  Dilation: 10 Effacement (%): 100 Station: -1 Exam by:: Johnanna Schneiders RN   Prenatal labs: ABO, Rh: B/Positive/-- (11/12 0824) Antibody: Negative (11/12 0824) Rubella: 11.30 (11/12 0824) RPR: Non Reactive (11/12 0824)  HBsAg: Negative (11/12 0824)  HIV: Non Reactive (11/12 0824)  GBS:    2 hr Glucola normal Genetic  screening  Not performed Anatomy US normal   Prenatal Transfer Tool  Maternal Diabetes: No Genetic Screening: Declined Maternal Ultrasounds/Referrals: Normal Fetal Ultrasounds or other Referrals:  Small for gestational age  Maternal Substance Abuse:  No  Significant Maternal Medications:  None Significant Maternal Lab Results: None  Results for orders placed or performed during the hospital encounter of 06/10/20 (from the past 24 hour(s))  CBC   Collection Time: 06/10/20  7:47 AM  Result Value Ref Range   WBC 8.9 4.0 - 10.5 K/uL   RBC 4.35 3.87 - 5.11 MIL/uL   Hemoglobin 8.9 (L) 12.0 - 15.0 g/dL   HCT 34.0 (L) 37.0 - 96.4 %   MCV 71.0 (L) 80.0 - 100.0 fL   MCH 20.5 (L) 26.0 - 34.0 pg   MCHC 28.8 (L) 30.0 - 36.0 g/dL   RDW 38.3 (H) 81.8 - 40.3 %   Platelets 330 150 - 400 K/uL   nRBC 0.0 0.0 - 0.2 %    Patient Active Problem List   Diagnosis Date Noted  . Normal labor 06/10/2020  . Iron deficiency anemia of pregnancy 06/09/2020  . SGA (small for gestational age) 05/09/2020  . Yeast vaginitis 05/09/2020  . History of IUGR (intrauterine growth retardation) and stillbirth, currently pregnant 04/18/2020  . Late prenatal care affecting pregnancy   . Language barrier, speaks [name] only 07/20/2015  . History of preterm delivery 04/07/2015  . Supervision of high risk pregnancy, antepartum 04/07/2015    Assessment/Plan:  Sylvia Bullock is a 24 y.o. F5O3606 at [redacted]w[redacted]d here for labor.   #Labor: Patient delivered shortly after arrival to L&D. See delivery note for details.  #Pain: unmedicated #FWB: Cat I #ID:  gbs neg  #MOF: formula  #MOC:undecided #Circ:  Needs to be discussed   Gita Kudo, MD  06/10/2020, 8:38 AM

## 2020-06-10 NOTE — MAU Note (Signed)
Patient came into MAU via EMS with c/o contractions at [redacted]w[redacted]d gestation. Patient denies LOF, denies vaginal bleeding. Patient speaks Sylvia Bullock interpreter obtained via audio interpreter.

## 2020-06-10 NOTE — Discharge Summary (Addendum)
Error

## 2020-06-11 ENCOUNTER — Encounter (HOSPITAL_COMMUNITY): Payer: Self-pay | Admitting: Obstetrics and Gynecology

## 2020-06-11 DIAGNOSIS — D649 Anemia, unspecified: Secondary | ICD-10-CM

## 2020-06-11 DIAGNOSIS — O9903 Anemia complicating the puerperium: Secondary | ICD-10-CM

## 2020-06-11 LAB — CBC
HCT: 22.7 % — ABNORMAL LOW (ref 36.0–46.0)
Hemoglobin: 6.7 g/dL — CL (ref 12.0–15.0)
MCH: 20.7 pg — ABNORMAL LOW (ref 26.0–34.0)
MCHC: 29.5 g/dL — ABNORMAL LOW (ref 30.0–36.0)
MCV: 70.1 fL — ABNORMAL LOW (ref 80.0–100.0)
Platelets: 284 10*3/uL (ref 150–400)
RBC: 3.24 MIL/uL — ABNORMAL LOW (ref 3.87–5.11)
RDW: 22.1 % — ABNORMAL HIGH (ref 11.5–15.5)
WBC: 9.5 10*3/uL (ref 4.0–10.5)
nRBC: 0 % (ref 0.0–0.2)

## 2020-06-11 MED ORDER — TETANUS-DIPHTH-ACELL PERTUSSIS 5-2.5-18.5 LF-MCG/0.5 IM SUSY: 0.5000 mL | PREFILLED_SYRINGE | Freq: Once | INTRAMUSCULAR | Status: DC

## 2020-06-11 MED ORDER — FERROUS SULFATE 325 (65 FE) MG PO TABS: 325.0000 mg | ORAL_TABLET | ORAL | 1 refills | Status: DC

## 2020-06-11 MED ORDER — INFLUENZA VAC SPLIT QUAD 0.5 ML IM SUSY: 0.5000 mL | PREFILLED_SYRINGE | INTRAMUSCULAR | Status: DC

## 2020-06-11 NOTE — Progress Notes (Signed)
Pt stated that she does not want a blood transfusion. States that the plan with the midwife is  to monitor herself over the next couple weeks and if she still feels bad she will come back for the blood transfusion.

## 2020-06-11 NOTE — Progress Notes (Signed)
6.7 hemoglobin level received when this RN doing DC teaching. CNM Kooistra notified with message

## 2020-06-12 DIAGNOSIS — Z789 Other specified health status: Secondary | ICD-10-CM

## 2020-06-12 DIAGNOSIS — D509 Iron deficiency anemia, unspecified: Secondary | ICD-10-CM

## 2020-06-12 DIAGNOSIS — Z9889 Other specified postprocedural states: Secondary | ICD-10-CM

## 2020-06-12 DIAGNOSIS — O9903 Anemia complicating the puerperium: Secondary | ICD-10-CM

## 2020-06-12 LAB — CBC
HCT: 26.2 % — ABNORMAL LOW (ref 36.0–46.0)
Hemoglobin: 8.1 g/dL — ABNORMAL LOW (ref 12.0–15.0)
MCH: 22.1 pg — ABNORMAL LOW (ref 26.0–34.0)
MCHC: 30.9 g/dL (ref 30.0–36.0)
MCV: 71.4 fL — ABNORMAL LOW (ref 80.0–100.0)
Platelets: 267 10*3/uL (ref 150–400)
RBC: 3.67 MIL/uL — ABNORMAL LOW (ref 3.87–5.11)
RDW: 21.2 % — ABNORMAL HIGH (ref 11.5–15.5)
WBC: 8.8 10*3/uL (ref 4.0–10.5)
nRBC: 0.2 % (ref 0.0–0.2)

## 2020-06-12 LAB — PREPARE RBC (CROSSMATCH)

## 2020-06-12 MED ORDER — SODIUM CHLORIDE 0.9% IV SOLUTION
Freq: Once | INTRAVENOUS | Status: AC
Start: 2020-06-12 — End: 2020-06-12

## 2020-06-12 MED ORDER — IBUPROFEN 100 MG/5ML PO SUSP: 600.0000 mg | Freq: Four times a day (QID) | ORAL | 1 refills | Status: DC | PRN

## 2020-06-12 NOTE — Progress Notes (Signed)
Ordered breakfast for patient 

## 2020-06-12 NOTE — Discharge Summary (Signed)
Postpartum Discharge Summary  **visit conducted with Santiago Glad interpreter through [employer]**                          Patient Name: Sylvia Bullock DOB: Sep 01, 1995 MRN: [id number]  Date of admission: 06/10/2020 Delivery date:06/10/2020  Delivering provider: Janet Berlin  Date of discharge: 06/12/2020  Admitting diagnosis: Normal labor [O80, Z37.9] Intrauterine pregnancy: [redacted]w[redacted]d     Secondary diagnosis:  Active Problems:   Normal labor   Vaginal delivery   Language barrier   SGA   Anemia during preg & postpartum  Additional problems:                                     Discharge diagnosis: Term Pregnancy Delivered; Anemia                                             Post partum procedures: Venofer infusion on PPD#1 (was previously ordered outpt but pt delivered prior to admin); blood transfusion on PPD#2 Augmentation: N/A Complications: None  Hospital course: Onset of Labor With Vaginal Delivery      24 y.o. yo [id number] at [redacted]w[redacted]d was admitted in Active Labor on 06/10/2020. Patient had an uncomplicated labor course as follows:  Membrane Rupture Time/Date: 7:33 AM ,06/10/2020   Delivery Method:Vaginal, Spontaneous  Episiotomy: None  Lacerations:  1st degree  Patient had a postpartum course remarkable for receiving tx for anemia. On admission her Hgb was 8.9 and on PPD#1 it was 6.7, at which time a blood tx was recommended but as she was asymptomatic, she declined and agreed to a Venofer infusion and planned for d/c. The baby was kept until PPD#2, and on that morning she reported dizziness with ambulation and ringing in her ears and agreed to the recommendation of a blood tx of 2u PRBCs. Prior to d/c she is ambulating, tolerating a regular diet, passing flatus, and urinating well. Patient is discharged home in stable condition on 06/12/20.  Newborn Data: Birth date:06/10/2020  Birth time:7:34 AM  Gender:Female  Living status:Living  Apgars:9 ,9  Weight:2801 g (6lb 2.8oz)  Magnesium  Sulfate received: No BMZ received: No Rhophylac:N/A MMR:N/A T-DaP: declined prenatally and postpartum  Flu: declined prenatally and postpartum Transfusion: 2uPRBC for symptomatic anemia on PPD#2  Physical exam        Vitals:   06/10/20 1434 06/10/20 1717 06/11/20 0012 06/11/20 0517  BP: 97/63 (!) 95/54 (!) 90/53 97/62  Pulse: 67 68 60 (!) 57  Resp: $Remo'17  16 16  'dEJrL$ Temp: 97.9 F (36.6 C) 97.9 F (36.6 C) 99.3 F (37.4 C) 99.1 F (37.3 C)  TempSrc: Axillary  Oral Oral  SpO2: 100% 100% 100% 99%   General: alert, cooperative and no distress Lochia: appropriate Uterine Fundus: firm Incision: N/A DVT Evaluation: No evidence of DVT seen on physical exam. Labs: Recent Labs       Lab Results  Component Value Date   WBC 8.9 06/10/2020   HGB 8.9 (L) 06/10/2020   HCT 30.9 (L) 06/10/2020   MCV 71.0 (L) 06/10/2020   PLT 330 06/10/2020     No flowsheet data found. Edinburgh Score: Edinburgh Postnatal Depression Scale Screening Tool 06/10/2020  I have been able to laugh and see the funny side of things. 0  I have looked forward with enjoyment to things. 0  I have blamed myself unnecessarily when things went wrong. 0  I have been anxious or worried for no good reason. 0  I have felt scared or panicky for no good reason. 0  Things have been getting on top of me. 0  I have been so unhappy that I have had difficulty sleeping. 0  I have felt sad or miserable. 1  I have been so unhappy that I have been crying. 0  The thought of harming myself has occurred to me. 0  Edinburgh Postnatal Depression Scale Total 1     After visit meds:  Allergies as of 06/11/2020   No Known Allergies        Medication List    STOP taking these medications   feeding supplement (ENSURE COMPLETE) Liqd   metroNIDAZOLE 0.75 % vaginal gel Commonly known as: METROGEL   terconazole 0.4 % vaginal cream Commonly known as: TERAZOL 7     TAKE these medications   CitraNatal Bloom  90-1 MG Tabs Take 1 tablet by mouth daily.   ferrous sulfate 325 (65 FE) MG tablet Commonly known as: FerrouSul Take 1 tablet (325 mg total) by mouth every other day.        Discharge home in stable condition Infant Feeding: No evidence of DVT seen on physical exam. Infant Disposition:home with mother Discharge instruction: per After Visit Summary and Postpartum booklet. Activity: Advance as tolerated. Pelvic rest for 6 weeks.  Diet: routine diet Future Appointments:       Future Appointments  Date Time Provider Wallace  06/15/2020  8:30 AM Truett Mainland, DO [facility] None   Follow up Visit:   Please schedule this patient for a In person postpartum visit in 6 weeks with the following provider: Any provider. Additional Postpartum F/U:none  Low risk pregnancy complicated by: language barrier Delivery mode: Vaginal, Spontaneous  Anticipated Birth Control: Unsure. She will think about it; she does not want nexplanon but is interested in IUD.    Myrtis Ser CNM 06/13/2020 9:36 AM

## 2020-06-12 NOTE — Discharge Instructions (Signed)

## 2020-06-12 NOTE — Clinical Social Work Maternal (Signed)
CLINICAL SOCIAL WORK MATERNAL/CHILD NOTE  Patient Details  Name: Sylvia Bullock MRN: [id number] Date of Birth: Sep 03, 1995  Date:  06/12/2020  Clinical Social Worker Initiating Note:  Hortencia Pilar, LCSW Date/Time: Initiated:  06/12/20/0100     Child's Name:  Sylvia Bullock   Biological Parents:  Mother,Father (Sylvia Bullock, Sylvia Bullock)   Need for Interpreter:  Other (Comment Required) [name])   Reason for Referral:  Late or No Prenatal Care    Address:  [address] [place] [address]    Phone number:  [phone] (home)     Additional phone number: none   Household Members/Support Persons (HM/SP):   Household Member/Support Person 1,Household Member/Support Person 2   HM/SP Name Relationship DOB or Age  HM/SP -1  Sylvia Bullock MOB Oct 30, 1995  HM/SP -2 Sylvia Bullock FOB 07/02/1991  HM/SP -3        HM/SP -4        HM/SP -5        HM/SP -6        HM/SP -7        HM/SP -8          Natural Supports (not living in the home):      Professional Supports: None   Employment: Unemployed   Type of Work: none   Education:  9 to 11 years   Homebound arranged: No  Financial Resources:  [employer]   Other Resources:  Sales executive ,WIC (MOB reported that she has plans to apply for [employer].)   Cultural/Religious Considerations Which May Impact Care:  none   Strengths:  Ability to meet basic needs ,Compliance with medical plan ,Home prepared for child ,Pediatrician chosen   Psychotropic Medications:     None     Pediatrician:     MOB reported that it has been chosen but is unsure of the name at this time.   Pediatrician List:   [facility]      Pediatrician Fax Number:    Risk Factors/Current Problems:  None   Cognitive State:  Insightful ,Able to Concentrate ,Alert    Mood/Affect:  Relaxed ,Comfortable ,Irritable ,Interested    CSW Assessment: CSW consulted due to [facility]  getting care starting at 29 weeks. CSW went to speak with MOB at bedside to address further needs.   CSW used Ileene Hutchinson [phone] to speak with MOB. CSW advised MOB of the HIPPA policy and asked that FOB leave room in which FOB did. CSW then advised MOB of CSW's role and the reason for CSW coming to speak with her. MOB expressed that she did get care late due to not knowing that she was pregnant. MOB expressed that once she confirmed that she was pregnant, then she started care and was able to follow up. CSW understanding and advised MOB of the hospital drug screen policy. MOB was advised that there are two different drug screens that are performed, UDS and CDS. MOB was advised that if either of infants UDS or CDS was positive then CSW would need to make a CPS report. MOB expressed that she understood and expressed that she didn't have any substance use while pregnant. MOB also reported no CPS hx to CSW.   CSW inquired from [facility] on her mental health hx in which MOB expressed that she has none. MOB denies SI,  HI and DV to CSW when asked. CSW asked MOB about her supports in which MOB expressed that her spouse is her primary support as well as her family. MOB reported that she has all needed items to care for infant with plans for infant to sleep in baby crib once arrived home. MOB did request referral in which CSW will make.   CSW took time to provide MOB with PPD and SIDS education. MOB was encouraged to assess herself and reach out to supports if PPD began. MOB expressed that she understood and expressed no other questions.   CSW will continue to monitor infants CDS and make CPS report if warranted. No barrier's.   CSW Plan/Description:  No Further Intervention Required/No Barriers to Discharge,Sudden Infant Death Syndrome (SIDS) Education,Perinatal Mood and Anxiety Disorder (PMADs) Education,CSW Will Continue to Monitor Umbilical Cord Tissue Drug Screen Results and Make Report if  [facility] Drug Screen Policy Information    Robb Matar, LCSWA 06/12/2020, 1:39 PM

## 2020-06-13 LAB — BPAM RBC
Blood Product Expiration Date: 202201082359
Blood Product Expiration Date: 202201082359
ISSUE DATE / TIME: 202112130855
ISSUE DATE / TIME: 202112131052
Unit Type and Rh: 7300
Unit Type and Rh: 7300

## 2020-06-13 LAB — TYPE AND SCREEN
ABO/RH(D): B POS
Antibody Screen: NEGATIVE
Unit division: 0
Unit division: 0

## 2020-06-13 LAB — GC/CHLAMYDIA PROBE AMP (~~LOC~~) NOT AT ARMC
Chlamydia: NEGATIVE
Comment: NEGATIVE
Comment: NORMAL
Neisseria Gonorrhea: NEGATIVE

## 2020-06-13 LAB — CULTURE, BETA STREP (GROUP B ONLY): Strep Gp B Culture: NEGATIVE

## 2020-06-15 ENCOUNTER — Encounter: Payer: Medicaid Other | Admitting: Family Medicine

## 2020-07-20 ENCOUNTER — Ambulatory Visit: Payer: Medicaid Other | Admitting: Family Medicine

## 2020-08-07 ENCOUNTER — Ambulatory Visit: Payer: Medicaid Other | Admitting: Obstetrics and Gynecology

## 2020-08-15 ENCOUNTER — Other Ambulatory Visit: Payer: Self-pay

## 2020-08-15 ENCOUNTER — Ambulatory Visit (INDEPENDENT_AMBULATORY_CARE_PROVIDER_SITE_OTHER): Payer: Medicaid Other | Admitting: Advanced Practice Midwife

## 2020-08-15 ENCOUNTER — Encounter: Payer: Self-pay | Admitting: General Practice

## 2020-08-15 ENCOUNTER — Encounter: Payer: Self-pay | Admitting: Advanced Practice Midwife

## 2020-08-15 NOTE — Progress Notes (Signed)
   A video interpreter used for entire visit - Tabe [id number] Post Partum Visit Note  Shaleka Sayre is a 25 y.o. [id number] female who presents for a postpartum visit. She is 9 weeks postpartum following a normal spontaneous vaginal delivery.  I have fully reviewed the prenatal and intrapartum course. The delivery was at 37.1gestational weeks.  Anesthesia: none. Postpartum course has been uneventful. Baby is doing well. Baby is feeding by bottle. Bleeding thin lochia. Bowel function is normal. Bladder function is normal. Patient is not sexually active. Contraception method is unsure. Postpartum depression screening: negative.Score0   The pregnancy intention screening data noted above was reviewed. Potential methods of contraception were discussed. The patient elected to proceed with Female Condom and Unknown/Not Reported. Wants other method of contraception but undecided which one.  The following portions of the patient's history were reviewed and updated as appropriate: allergies, current medications, past family history, past medical history, past social history, past surgical history and problem list.  Review of Systems Pertinent items noted in HPI and remainder of comprehensive ROS otherwise negative.    Objective:  There were no vitals taken for this visit.   General:  alert, cooperative and no distress   Breasts:  inspection negative, no nipple discharge or bleeding, no masses or nodularity palpable  Lungs: respirations unlabored  Heart:  HR Reg  Abdomen: soft, nontender   Vulva:  not evaluated  Vagina: not evaluated  Cervix:  n/a  Corpus: not examined  Adnexa:  not evaluated  Rectal Exam: Not performed.        Assessment:    Normal  postpartum exam. Pap smear not done at today's visit.   Plan:   Essential components of care per ACOG recommendations:  1.  Mood and well being: Patient with negative depression screening today. Reviewed local resources for support.  - Patient does not use  tobacco. - hx of drug use? No   2. Infant care and feeding:  -Patient currently breastmilk feeding? No  -Social determinants of health (SDOH) reviewed in EPIC. No concerns 3. Sexuality, contraception and birth spacing - Patient does not want a pregnancy in the next year.  Desired family size is unsure children.  - Reviewed forms of contraception in tiered fashion. Patient desired Will decide later and will use condoms for now today.   - Discussed birth spacing of 18 months Reviewed all available methods of contraception and patient will discuss with husband and decide later.  Bottle feeding exclusively  4. Sleep and fatigue -Encouraged family/partner/community support of 4 hrs of uninterrupted sleep to help with mood and fatigue  5. Physical Recovery  - Discussed patients delivery and complications - Patient had a 1st degree laceration, perineal healing reviewed. Patient expressed understanding - Patient has urinary incontinence? No  - Patient is safe to resume physical and sexual activity  6.  Health Maintenance - Last pap smear done [phone] and was normal with negative HPV.   7. noChronic Disease  Aviva Signs, CNM  Armandina Stammer, RN Center for [employer], [facility]

## 2020-08-15 NOTE — Patient Instructions (Signed)

## 2021-05-28 ENCOUNTER — Encounter: Payer: Self-pay | Admitting: Family Medicine

## 2021-05-28 DIAGNOSIS — O0930 Supervision of pregnancy with insufficient antenatal care, unspecified trimester: Secondary | ICD-10-CM | POA: Insufficient documentation

## 2021-05-30 ENCOUNTER — Ambulatory Visit (INDEPENDENT_AMBULATORY_CARE_PROVIDER_SITE_OTHER): Payer: Medicaid Other | Admitting: Family Medicine

## 2021-05-30 ENCOUNTER — Encounter: Payer: Self-pay | Admitting: Family Medicine

## 2021-05-30 ENCOUNTER — Encounter: Payer: Self-pay | Admitting: General Practice

## 2021-05-30 ENCOUNTER — Other Ambulatory Visit (HOSPITAL_COMMUNITY)
Admission: RE | Admit: 2021-05-30 | Discharge: 2021-05-30 | Disposition: A | Discharge status: Home / Self Care | Payer: Medicaid Other | Source: Ambulatory Visit | Attending: Family Medicine | Admitting: Family Medicine

## 2021-05-30 ENCOUNTER — Other Ambulatory Visit: Payer: Self-pay

## 2021-05-30 VITALS — BP 106/64 | HR 62 | Wt 104.0 lb

## 2021-05-30 DIAGNOSIS — O09899 Supervision of other high risk pregnancies, unspecified trimester: Secondary | ICD-10-CM

## 2021-05-30 DIAGNOSIS — D509 Iron deficiency anemia, unspecified: Secondary | ICD-10-CM

## 2021-05-30 DIAGNOSIS — Z3A27 27 weeks gestation of pregnancy: Secondary | ICD-10-CM | POA: Diagnosis not present

## 2021-05-30 DIAGNOSIS — Z789 Other specified health status: Secondary | ICD-10-CM

## 2021-05-30 DIAGNOSIS — O0932 Supervision of pregnancy with insufficient antenatal care, second trimester: Secondary | ICD-10-CM | POA: Diagnosis not present

## 2021-05-30 DIAGNOSIS — O99012 Anemia complicating pregnancy, second trimester: Secondary | ICD-10-CM | POA: Diagnosis not present

## 2021-05-30 DIAGNOSIS — Z3480 Encounter for supervision of other normal pregnancy, unspecified trimester: Secondary | ICD-10-CM | POA: Diagnosis present

## 2021-05-30 DIAGNOSIS — O99019 Anemia complicating pregnancy, unspecified trimester: Secondary | ICD-10-CM

## 2021-05-30 DIAGNOSIS — Z8751 Personal history of pre-term labor: Secondary | ICD-10-CM | POA: Diagnosis not present

## 2021-05-30 DIAGNOSIS — O09892 Supervision of other high risk pregnancies, second trimester: Secondary | ICD-10-CM

## 2021-05-30 DIAGNOSIS — O0930 Supervision of pregnancy with insufficient antenatal care, unspecified trimester: Secondary | ICD-10-CM

## 2021-05-30 MED ORDER — FAMOTIDINE 20 MG PO TABS: 20.0000 mg | ORAL_TABLET | Freq: Two times a day (BID) | ORAL | 3 refills | Status: DC

## 2021-05-30 NOTE — Progress Notes (Signed)
[employer] language services interpreter [place] [phone].

## 2021-05-30 NOTE — Progress Notes (Signed)
Subjective:  Sylvia Bullock is a [id number] [redacted]w[redacted]d by LMP being seen today for her first obstetrical visit.  Her obstetrical history is significant for  5 vaginal deliveries, including one delivery at 21 weeks. Subsequent vaginal deliveries between 36 and 39 weeks. She is late to care  . Patient does not intend to breast feed. Pregnancy history fully reviewed.  Patient reports heartburn.  BP 106/64   Pulse 62   Wt 104 lb (47.2 kg)   LMP 11/24/2020   BMI 19.02 kg/m   HISTORY: OB History  Gravida Para Term Preterm AB Living  6 5 2 3  0 4  SAB IAB Ectopic Multiple Live Births  0 0 0 0 5    # Outcome Date GA Lbr Len/2nd Weight Sex Delivery Anes PTL Lv  6 Current           5 Term 06/10/20 [redacted]w[redacted]d 04:15 / 00:19 6 lb 2.8 oz (2.801 kg) M Vag-Spont None  LIV  4 Term 12/26/17 105w0d / 00:04 5 lb 12.2 oz (2.614 kg) M Vag-Spont EPI  LIV  3 Preterm 06/02/16    F Vag-Spont  Y LIV  2 Preterm 08/15/15 [redacted]w[redacted]d / 00:15 5 lb 5.7 oz (2.43 kg) M Vag-Spont None  LIV  1 Preterm 03/30/14 [redacted]w[redacted]d   F Vag-Spont None  ND    Obstetric Comments  Delivered infant at 21 weeks but died shortly after birth due to prematurity.    Past Medical History:  Diagnosis Date   Anemia     Past Surgical History:  Procedure Laterality Date   NO PAST SURGERIES      Family History  Problem Relation Age of Onset   Cancer Neg Hx    Asthma Neg Hx    Birth defects Neg Hx      Exam  BP 106/64   Pulse 62   Wt 104 lb (47.2 kg)   LMP 11/24/2020   BMI 19.02 kg/m   Chaperone present during exam  CONSTITUTIONAL: Well-developed, well-nourished female in no acute distress.  HENT:  Normocephalic, atraumatic, External right and left ear normal. Oropharynx is clear and moist EYES: Conjunctivae and EOM are normal. Pupils are equal, round, and reactive to light. No scleral icterus.  NECK: Normal range of motion, supple, no masses.  Normal thyroid.  CARDIOVASCULAR: Normal heart rate noted, regular rhythm RESPIRATORY: Clear to  auscultation bilaterally. Effort and breath sounds normal, no problems with respiration noted. BREASTS: Symmetric in size. No masses, skin changes, nipple drainage, or lymphadenopathy. ABDOMEN: Soft, normal bowel sounds, no distention noted.  No tenderness, rebound or guarding.  PELVIC: Normal appearing external genitalia; normal appearing vaginal mucosa and cervix. No abnormal discharge noted. Normal uterine size, no other palpable masses, no uterine or adnexal tenderness. MUSCULOSKELETAL: Normal range of motion. No tenderness.  No cyanosis, clubbing, or edema.  2+ distal pulses. SKIN: Skin is warm and dry. No rash noted. Not diaphoretic. No erythema. No pallor. NEUROLOGIC: Alert and oriented to person, place, and time. Normal reflexes, muscle tone coordination. No cranial nerve deficit noted. PSYCHIATRIC: Normal mood and affect. Normal behavior. Normal judgment and thought content.    Assessment:    Pregnancy: 11/26/2020 Patient Active Problem List   Diagnosis Date Noted   Supervision of other normal pregnancy, antepartum 05/30/2021   Late prenatal care 05/28/2021   Iron deficiency anemia of pregnancy 06/09/2020   Language barrier, speaks 14/03/2020 only 07/20/2015   History of preterm delivery 04/07/2015      Plan:   1. Late  prenatal care - CBC/D/Plt+RPR+Rh+ABO+RubIgG... - Culture, OB Urine - [place] MFM OB DETAIL +14 WK; Future  2. Supervision of other normal pregnancy, antepartum - CBC/D/Plt+RPR+Rh+ABO+RubIgG... - Culture, OB Urine - [place] MFM OB DETAIL +14 WK; Future - GC/Chlamydia probe amp ([place])not at [facility]  3. Iron deficiency anemia of pregnancy Check CBC today. Does not tolerate oral iron  4. Language barrier, speaks [name] only Equities trader used  6. Short interval between pregnancies affecting pregnancy, antepartum    Initial labs obtained Continue prenatal vitamins Reviewed n/v relief measures and warning s/s to report Reviewed recommended weight gain based on  pre-gravid BMI Encouraged well-balanced diet Genetic & carrier screening discussed: requests Panorama,  Ultrasound discussed; fetal survey: requested CCNC completed> form faxed if has or is planning to apply for medicaid The nature of [facility] [employer] with multiple MDs and other Advanced Practice Providers was explained to patient; also emphasized that fellows, residents, and students are part of our team.   Problem list reviewed and updated. 75% of 30 min visit spent on counseling and coordination of care.     Levie Heritage 05/30/2021

## 2021-05-31 ENCOUNTER — Other Ambulatory Visit: Payer: Self-pay | Admitting: Family Medicine

## 2021-05-31 DIAGNOSIS — D509 Iron deficiency anemia, unspecified: Secondary | ICD-10-CM

## 2021-05-31 LAB — CBC/D/PLT+RPR+RH+ABO+RUBIGG...
Antibody Screen: NEGATIVE
Basophils Absolute: 0 10*3/uL (ref 0.0–0.2)
Basos: 0 %
EOS (ABSOLUTE): 0.1 10*3/uL (ref 0.0–0.4)
Eos: 1 %
HCV Ab: 0.1 s/co ratio (ref 0.0–0.9)
HIV Screen 4th Generation wRfx: NONREACTIVE
Hematocrit: 25.7 % — ABNORMAL LOW (ref 34.0–46.6)
Hemoglobin: 7.6 g/dL — ABNORMAL LOW (ref 11.1–15.9)
Hepatitis B Surface Ag: NEGATIVE
Immature Grans (Abs): 0.1 10*3/uL (ref 0.0–0.1)
Immature Granulocytes: 1 %
Lymphocytes Absolute: 1.6 10*3/uL (ref 0.7–3.1)
Lymphs: 22 %
MCH: 20.3 pg — ABNORMAL LOW (ref 26.6–33.0)
MCHC: 29.6 g/dL — ABNORMAL LOW (ref 31.5–35.7)
MCV: 69 fL — ABNORMAL LOW (ref 79–97)
Monocytes Absolute: 0.7 10*3/uL (ref 0.1–0.9)
Monocytes: 10 %
Neutrophils Absolute: 4.6 10*3/uL (ref 1.4–7.0)
Neutrophils: 66 %
Platelets: 466 10*3/uL — ABNORMAL HIGH (ref 150–450)
RBC: 3.75 x10E6/uL — ABNORMAL LOW (ref 3.77–5.28)
RDW: 16.8 % — ABNORMAL HIGH (ref 11.7–15.4)
RPR Ser Ql: NONREACTIVE
Rh Factor: POSITIVE
Rubella Antibodies, IGG: 9.75 index (ref >0.99)
WBC: 7 10*3/uL (ref 3.4–10.8)

## 2021-05-31 LAB — GC/CHLAMYDIA PROBE AMP (~~LOC~~) NOT AT ARMC
Chlamydia: NEGATIVE
Comment: NEGATIVE
Comment: NORMAL
Neisseria Gonorrhea: NEGATIVE

## 2021-05-31 LAB — HCV INTERPRETATION

## 2021-06-01 LAB — URINE CULTURE, OB REFLEX

## 2021-06-01 LAB — CULTURE, OB URINE

## 2021-06-04 ENCOUNTER — Telehealth: Payer: Self-pay

## 2021-06-04 NOTE — Telephone Encounter (Signed)
Attempted to reach patients husband to notify of schedule appt changes.   Patient will received iron infusion on Friday Dec 16th at 9am. And therefore we had to move her OB/ gtt appointment to Dec 15th at 835am. Armandina Stammer RN

## 2021-06-14 ENCOUNTER — Encounter: Payer: Medicaid Other | Admitting: Family Medicine

## 2021-06-15 ENCOUNTER — Encounter: Payer: Medicaid Other | Admitting: Family Medicine

## 2021-06-15 ENCOUNTER — Ambulatory Visit (INDEPENDENT_AMBULATORY_CARE_PROVIDER_SITE_OTHER): Payer: Medicaid Other | Admitting: Family Medicine

## 2021-06-15 ENCOUNTER — Other Ambulatory Visit: Payer: Self-pay

## 2021-06-15 ENCOUNTER — Encounter: Payer: Self-pay | Admitting: General Practice

## 2021-06-15 ENCOUNTER — Inpatient Hospital Stay (HOSPITAL_COMMUNITY): Admission: RE | Admit: 2021-06-15 | Payer: Medicaid Other | Source: Ambulatory Visit

## 2021-06-15 VITALS — BP 108/56 | HR 60 | Wt 105.0 lb

## 2021-06-15 DIAGNOSIS — O0930 Supervision of pregnancy with insufficient antenatal care, unspecified trimester: Secondary | ICD-10-CM

## 2021-06-15 DIAGNOSIS — Z3480 Encounter for supervision of other normal pregnancy, unspecified trimester: Secondary | ICD-10-CM

## 2021-06-15 DIAGNOSIS — O0933 Supervision of pregnancy with insufficient antenatal care, third trimester: Secondary | ICD-10-CM

## 2021-06-15 DIAGNOSIS — D509 Iron deficiency anemia, unspecified: Secondary | ICD-10-CM | POA: Diagnosis not present

## 2021-06-15 DIAGNOSIS — O99013 Anemia complicating pregnancy, third trimester: Secondary | ICD-10-CM | POA: Diagnosis not present

## 2021-06-15 DIAGNOSIS — Z789 Other specified health status: Secondary | ICD-10-CM

## 2021-06-15 DIAGNOSIS — O99019 Anemia complicating pregnancy, unspecified trimester: Secondary | ICD-10-CM

## 2021-06-15 DIAGNOSIS — Z8751 Personal history of pre-term labor: Secondary | ICD-10-CM

## 2021-06-15 DIAGNOSIS — Z3A29 29 weeks gestation of pregnancy: Secondary | ICD-10-CM

## 2021-06-15 NOTE — Progress Notes (Signed)
[employer] language services Bu [phone].

## 2021-06-15 NOTE — Progress Notes (Signed)
° °  PRENATAL VISIT NOTE  Subjective:  Sylvia Bullock is a 25 y.o. [id number] at [redacted]w[redacted]d being seen today for ongoing prenatal care.  She is currently monitored for the following issues for this high-risk pregnancy and has History of preterm delivery; Language barrier, speaks [name] only; Iron deficiency anemia of pregnancy; Late prenatal care; and Supervision of other normal pregnancy, antepartum on their problem list.  Patient reports no complaints.  Contractions: Not present. Vag. Bleeding: None.  Movement: Present. Denies leaking of fluid.   The following portions of the patient's history were reviewed and updated as appropriate: allergies, current medications, past family history, past medical history, past social history, past surgical history and problem list.   Objective:   Vitals:   06/15/21 0822  BP: (!) 108/56  Pulse: 60  Weight: 105 lb (47.6 kg)    Fetal Status: Fetal Heart Rate (bpm): 136 Fundal Height: 28 cm Movement: Present     General:  Alert, oriented and cooperative. Patient is in no acute distress.  Skin: Skin is warm and dry. No rash noted.   Cardiovascular: Normal heart rate noted  Respiratory: Normal respiratory effort, no problems with respiration noted  Abdomen: Soft, gravid, appropriate for gestational age.  Pain/Pressure: Absent     Pelvic: Cervical exam deferred        Extremities: Normal range of motion.  Edema: None  Mental Status: Normal mood and affect. Normal behavior. Normal judgment and thought content.   Assessment and Plan:  Pregnancy: [id number] at [redacted]w[redacted]d 1. [redacted] weeks gestation of pregnancy FHT and FH normal - Glucose Tolerance, 2 Hours w/1 Hour  2. Supervision of other normal pregnancy, antepartum - Glucose Tolerance, 2 Hours w/1 Hour  3. History of preterm delivery Watch pregnancy closely  4. Language barrier, speaks [name] only Equities trader used  5. Iron deficiency anemia of pregnancy Is scheduled for iron infusions. Will need recheck around 36  weeks.  6. Late prenatal care   Preterm labor symptoms and general obstetric precautions including but not limited to vaginal bleeding, contractions, leaking of fluid and fetal movement were reviewed in detail with the patient. Please refer to After Visit Summary for other counseling recommendations.   No follow-ups on file.  Future Appointments  Date Time Provider Department Center  06/18/2021  1:15 PM [facility] NURSE [facility]  06/18/2021  1:30 PM [facility] US3 [facility]  06/22/2021  9:00 AM [facility] None  06/28/2021 10:55 AM Adrian Blackwater, Rhona Raider, DO [facility] None  07/12/2021 10:35 AM Levie Heritage, DO [facility] None  07/26/2021 10:35 AM Levie Heritage, DO [facility] None  08/09/2021 10:35 AM Levie Heritage, DO [facility] None  08/16/2021 10:35 AM Levie Heritage, DO [facility] None  08/23/2021 10:35 AM Levie Heritage, DO [facility] None  08/30/2021 10:35 AM Levie Heritage, DO [facility] None    Levie Heritage, DO

## 2021-06-16 LAB — GLUCOSE TOLERANCE, 2 HOURS W/ 1HR
Glucose, 1 hour: 105 mg/dL (ref 70–179)
Glucose, 2 hour: 94 mg/dL (ref 70–152)
Glucose, Fasting: 69 mg/dL — ABNORMAL LOW (ref 70–91)

## 2021-06-18 ENCOUNTER — Ambulatory Visit: Payer: Medicaid Other | Admitting: *Deleted

## 2021-06-18 ENCOUNTER — Other Ambulatory Visit: Payer: Self-pay | Admitting: *Deleted

## 2021-06-18 ENCOUNTER — Ambulatory Visit: Payer: Medicaid Other | Attending: Family Medicine

## 2021-06-18 ENCOUNTER — Other Ambulatory Visit: Payer: Self-pay

## 2021-06-18 VITALS — BP 108/66 | HR 58

## 2021-06-18 DIAGNOSIS — Z363 Encounter for antenatal screening for malformations: Secondary | ICD-10-CM | POA: Insufficient documentation

## 2021-06-18 DIAGNOSIS — Z8751 Personal history of pre-term labor: Secondary | ICD-10-CM

## 2021-06-18 DIAGNOSIS — Z3480 Encounter for supervision of other normal pregnancy, unspecified trimester: Secondary | ICD-10-CM

## 2021-06-18 DIAGNOSIS — O09213 Supervision of pregnancy with history of pre-term labor, third trimester: Secondary | ICD-10-CM

## 2021-06-18 DIAGNOSIS — Z3A30 30 weeks gestation of pregnancy: Secondary | ICD-10-CM

## 2021-06-18 DIAGNOSIS — O0930 Supervision of pregnancy with insufficient antenatal care, unspecified trimester: Secondary | ICD-10-CM

## 2021-06-18 DIAGNOSIS — O09893 Supervision of other high risk pregnancies, third trimester: Secondary | ICD-10-CM | POA: Diagnosis not present

## 2021-06-18 DIAGNOSIS — O09293 Supervision of pregnancy with other poor reproductive or obstetric history, third trimester: Secondary | ICD-10-CM | POA: Insufficient documentation

## 2021-06-18 DIAGNOSIS — O0933 Supervision of pregnancy with insufficient antenatal care, third trimester: Secondary | ICD-10-CM | POA: Diagnosis not present

## 2021-06-18 NOTE — Progress Notes (Signed)
[employer] interpreter used [name])

## 2021-06-21 ENCOUNTER — Encounter: Payer: Medicaid Other | Admitting: Family Medicine

## 2021-06-22 ENCOUNTER — Ambulatory Visit (HOSPITAL_COMMUNITY)
Admission: RE | Admit: 2021-06-22 | Discharge: 2021-06-22 | Disposition: A | Discharge status: Home / Self Care | Payer: Medicaid Other | Source: Ambulatory Visit | Attending: Family Medicine | Admitting: Family Medicine

## 2021-06-22 ENCOUNTER — Other Ambulatory Visit: Payer: Self-pay

## 2021-06-22 DIAGNOSIS — O99013 Anemia complicating pregnancy, third trimester: Secondary | ICD-10-CM | POA: Diagnosis present

## 2021-06-22 DIAGNOSIS — Z3A00 Weeks of gestation of pregnancy not specified: Secondary | ICD-10-CM | POA: Diagnosis not present

## 2021-06-22 MED ORDER — ALBUTEROL SULFATE (2.5 MG/3ML) 0.083% IN NEBU: 2.5000 mg | INHALATION_SOLUTION | Freq: Once | RESPIRATORY_TRACT | Status: DC | PRN

## 2021-06-22 MED ORDER — DIPHENHYDRAMINE HCL 50 MG/ML IJ SOLN: 25.0000 mg | Freq: Once | INTRAMUSCULAR | Status: DC | PRN

## 2021-06-22 MED ORDER — SODIUM CHLORIDE 0.9 % IV SOLN
300.0000 mg | INTRAVENOUS | Status: DC
  Administered 2021-06-22: 10:00:00 300 mg via INTRAVENOUS
  Filled 2021-06-22: qty 300

## 2021-06-22 MED ORDER — EPINEPHRINE PF 1 MG/ML IJ SOLN: 0.3000 mg | Freq: Once | INTRAMUSCULAR | Status: DC | PRN

## 2021-06-22 MED ORDER — SODIUM CHLORIDE 0.9 % IV SOLN: INTRAVENOUS | Status: DC | PRN

## 2021-06-22 MED ORDER — SODIUM CHLORIDE 0.9 % IV BOLUS: 500.0000 mL | Freq: Once | INTRAVENOUS | Status: DC | PRN

## 2021-06-22 MED ORDER — METHYLPREDNISOLONE SODIUM SUCC 125 MG IJ SOLR: 125.0000 mg | Freq: Once | INTRAMUSCULAR | Status: DC | PRN

## 2021-06-28 ENCOUNTER — Encounter: Payer: Medicaid Other | Admitting: Family Medicine

## 2021-06-29 ENCOUNTER — Encounter (HOSPITAL_COMMUNITY): Payer: Medicaid Other

## 2021-07-04 ENCOUNTER — Encounter: Payer: Self-pay | Admitting: General Practice

## 2021-07-06 ENCOUNTER — Encounter (HOSPITAL_COMMUNITY): Payer: Medicaid Other

## 2021-07-12 ENCOUNTER — Encounter: Payer: Medicaid Other | Admitting: Family Medicine

## 2021-07-12 NOTE — Progress Notes (Signed)
° °  PRENATAL VISIT NOTE  Subjective:  Sylvia Bullock is a 26 y.o. [id number] at [redacted]w[redacted]d being seen today for ongoing prenatal care.  She is currently monitored for the following issues for this high-risk pregnancy and has History of preterm delivery; Language barrier, speaks Santiago Glad only; Iron deficiency anemia of pregnancy; Late prenatal care; and Supervision of other normal pregnancy, antepartum on their problem list.  Patient reports no complaints except some reflux. She is not yet taking anything.   Contractions: Not present. Vag. Bleeding: None.  Movement: Present. Denies leaking of fluid.   The following portions of the patient's history were reviewed and updated as appropriate: allergies, current medications, past family history, past medical history, past social history, past surgical history and problem list.   Objective:   Vitals:   07/13/21 0855  BP: (!) 102/58  Pulse: 68  Weight: 105 lb (47.6 kg)    Fetal Status: Fetal Heart Rate (bpm): 135 Fundal Height: 33 cm Movement: Present     General:  Alert, oriented and cooperative. Patient is in no acute distress.  Skin: Skin is warm and dry. No rash noted.   Cardiovascular: Normal heart rate noted  Respiratory: Normal respiratory effort, no problems with respiration noted  Abdomen: Soft, gravid, appropriate for gestational age.  Pain/Pressure: Absent     Pelvic: Cervical exam deferred        Extremities: Normal range of motion.  Edema: None  Mental Status: Normal mood and affect. Normal behavior. Normal judgment and thought content.   Assessment and Plan:  Pregnancy: [id number] at [redacted]w[redacted]d 1. History of preterm delivery 21 wk loss and 36wk PTD. Subsequent deliveries have been term.   2. Language barrier, speaks Santiago Glad only Interpreter used throughout the appointment  3. Iron deficiency anemia of pregnancy She had one infusion. She did not go for her second one. She will go if we think she needs it. Reviewed anemia and recommended it. She also  has not been taking PNV. Resent Rx for this and reflux.   4. Late prenatal care Presented at 26w  5. Supervision of other normal pregnancy, antepartum Labs up to date GBS and cx next time Reviewed birth control -- she would like ppIUD following our discussion of options. She has done Nexplanon in the past and it did not work for her due to bleeding.  Reviewed where to go in labor and who her doctor is.   Preterm labor symptoms and general obstetric precautions including but not limited to vaginal bleeding, contractions, leaking of fluid and fetal movement were reviewed in detail with the patient. Please refer to After Visit Summary for other counseling recommendations.   Return in about 2 weeks (around 07/27/2021) for OB VISIT, MD or APP.  Future Appointments  Date Time Provider [place]  07/20/2021  3:30 PM [facility]  07/20/2021  3:45 PM [facility] US4 [facility]  07/26/2021 10:35 AM Truett Mainland, DO [facility] None  08/09/2021 10:35 AM Truett Mainland, DO [facility] None  08/16/2021 10:35 AM Truett Mainland, DO [facility] None  08/23/2021 10:35 AM Truett Mainland, DO [facility] None  08/30/2021 10:35 AM Nehemiah Settle, Tanna Savoy, DO [facility] None    Radene Gunning, MD

## 2021-07-13 ENCOUNTER — Ambulatory Visit (INDEPENDENT_AMBULATORY_CARE_PROVIDER_SITE_OTHER): Payer: Medicaid Other | Admitting: Obstetrics and Gynecology

## 2021-07-13 ENCOUNTER — Other Ambulatory Visit: Payer: Self-pay

## 2021-07-13 VITALS — BP 102/58 | HR 68 | Wt 105.0 lb

## 2021-07-13 DIAGNOSIS — Z8751 Personal history of pre-term labor: Secondary | ICD-10-CM | POA: Diagnosis not present

## 2021-07-13 DIAGNOSIS — O0933 Supervision of pregnancy with insufficient antenatal care, third trimester: Secondary | ICD-10-CM | POA: Diagnosis not present

## 2021-07-13 DIAGNOSIS — D509 Iron deficiency anemia, unspecified: Secondary | ICD-10-CM | POA: Diagnosis not present

## 2021-07-13 DIAGNOSIS — Z789 Other specified health status: Secondary | ICD-10-CM

## 2021-07-13 DIAGNOSIS — O99013 Anemia complicating pregnancy, third trimester: Secondary | ICD-10-CM

## 2021-07-13 DIAGNOSIS — Z3480 Encounter for supervision of other normal pregnancy, unspecified trimester: Secondary | ICD-10-CM

## 2021-07-13 DIAGNOSIS — O99019 Anemia complicating pregnancy, unspecified trimester: Secondary | ICD-10-CM

## 2021-07-13 DIAGNOSIS — O0930 Supervision of pregnancy with insufficient antenatal care, unspecified trimester: Secondary | ICD-10-CM

## 2021-07-13 DIAGNOSIS — O0990 Supervision of high risk pregnancy, unspecified, unspecified trimester: Secondary | ICD-10-CM

## 2021-07-13 DIAGNOSIS — Z3A34 34 weeks gestation of pregnancy: Secondary | ICD-10-CM

## 2021-07-13 MED ORDER — FAMOTIDINE 20 MG PO TABS: 20.0000 mg | ORAL_TABLET | Freq: Two times a day (BID) | ORAL | 3 refills | Status: AC

## 2021-07-13 MED ORDER — PRENATAL PLUS 27-1 MG PO TABS: 1.0000 | ORAL_TABLET | Freq: Every day | ORAL | 2 refills | Status: AC

## 2021-07-13 NOTE — Progress Notes (Signed)
Patient asked about iron infusions and husband states that they lost the address and then I asked if she would like to reschedule the appointments and patient states that she doesn't think she needs them anymore.

## 2021-07-20 ENCOUNTER — Ambulatory Visit: Payer: Medicaid Other

## 2021-07-20 ENCOUNTER — Ambulatory Visit: Payer: Medicaid Other | Attending: Obstetrics and Gynecology

## 2021-07-26 ENCOUNTER — Encounter: Payer: Medicaid Other | Admitting: Family Medicine

## 2021-07-27 ENCOUNTER — Encounter (HOSPITAL_COMMUNITY): Payer: Medicaid Other

## 2021-08-09 ENCOUNTER — Encounter: Payer: Medicaid Other | Admitting: Family Medicine

## 2021-08-16 ENCOUNTER — Encounter: Payer: Medicaid Other | Admitting: Family Medicine

## 2021-08-23 ENCOUNTER — Encounter: Payer: Medicaid Other | Admitting: Family Medicine

## 2021-08-23 ENCOUNTER — Ambulatory Visit: Payer: Medicaid Other | Admitting: Family Medicine

## 2021-08-23 NOTE — Progress Notes (Deleted)
° ° °  Post Partum Visit Note  Sylvia Bullock is a 26 y.o. [id number] female who presents for a postpartum visit. She is 5 weeks postpartum following a normal spontaneous vaginal delivery.  I have fully reviewed the prenatal and intrapartum course. The delivery was at 35 gestational weeks at [facility].  Anesthesia: {anesthesia types:812}. Postpartum course has been ***. Baby is doing well. Baby is feeding by {breast/bottle:69}. Bleeding {vag bleed:12292}. Bowel function is {normal:32111}. Bladder function is {normal:32111}. Patient {is/is not:9024} sexually active. Contraception method is {contraceptive method:5051}. Postpartum depression screening: {gen negative/positive:315881}.   The pregnancy intention screening data noted above was reviewed. Potential methods of contraception were discussed. The patient elected to proceed with No data recorded.    Health Maintenance Due  Topic Date Due   COVID-19 Vaccine (1) Never done   HPV VACCINES (1 - 2-dose series) Never done   INFLUENZA VACCINE  01/29/2021    {Common ambulatory SmartLinks:19316}  Review of Systems {ros; complete:30496}  Objective:  LMP 10/25/2020    General:  {gen appearance:16600}   Breasts:  {desc; normal/abnormal/not indicated:14647}  Lungs: {lung exam:16931}  Heart:  {heart exam:5510}  Abdomen: {abdomen exam:16834}   Wound {Wound assessment:11097}  GU exam:  {desc; normal/abnormal/not indicated:14647}       Assessment:    There are no diagnoses linked to this encounter.  *** postpartum exam.   Plan:   Essential components of care per ACOG recommendations:  1.  Mood and well being: Patient with {gen negative/positive:315881} depression screening today. Reviewed local resources for support.  - Patient tobacco use? {tobacco use:25506}  - hx of drug use? {yes/no:25505}    2. Infant care and feeding:  -Patient currently breastmilk feeding? {yes/no:25502}  -Social determinants of  health (SDOH) reviewed in EPIC. No concerns***The following needs were identified***  3. Sexuality, contraception and birth spacing - Patient {DOES_DOES YFV:49449} want a pregnancy in the next year.  Desired family size is {NUMBER 1-10:22536} children.  - Reviewed forms of contraception in tiered fashion. Patient desired {PLAN CONTRACEPTION:313102} today.   - Discussed birth spacing of 18 months  4. Sleep and fatigue -Encouraged family/partner/community support of 4 hrs of uninterrupted sleep to help with mood and fatigue  5. Physical Recovery  - Discussed patients delivery and complications. She describes her labor as {description:25511} - Patient had a {CHL AMB DELIVERY:[phone]}. Patient had a {laceration:25518} laceration. Perineal healing reviewed. Patient expressed understanding - Patient has urinary incontinence? {yes/no:25515} - Patient {ACTION; IS/IS QPR:91638466} safe to resume physical and sexual activity  6.  Health Maintenance - HM due items addressed {Yes or If no, why not?:20788} - Last pap smear  Diagnosis  Date Value Ref Range Status  09/17/2019   Final   - Negative for intraepithelial lesion or malignancy (NILM)   Pap smear {done:10129} at today's visit.  -Breast Cancer screening indicated? {indicated:25516}  7. Chronic Disease/Pregnancy Condition follow up: {Follow up:25499}  - PCP follow up  Armandina Stammer, RN [facility] [employer], [facility]

## 2021-08-30 ENCOUNTER — Encounter: Payer: Medicaid Other | Admitting: Family Medicine

## 2021-08-30 ENCOUNTER — Ambulatory Visit: Payer: Medicaid Other | Admitting: Family Medicine

## 2021-10-01 ENCOUNTER — Encounter: Payer: Self-pay | Admitting: Obstetrics and Gynecology

## 2021-10-01 ENCOUNTER — Ambulatory Visit (INDEPENDENT_AMBULATORY_CARE_PROVIDER_SITE_OTHER): Payer: Medicaid Other | Admitting: Obstetrics and Gynecology

## 2021-10-01 DIAGNOSIS — Z30430 Encounter for insertion of intrauterine contraceptive device: Secondary | ICD-10-CM

## 2021-10-01 LAB — POCT URINE PREGNANCY: Preg Test, Ur: NEGATIVE

## 2021-10-01 MED ORDER — LEVONORGESTREL 20.1 MCG/DAY IU IUD
1.0000 | INTRAUTERINE_SYSTEM | Freq: Once | INTRAUTERINE | Status: AC
  Administered 2021-10-01: 1 via INTRAUTERINE

## 2021-10-01 NOTE — Progress Notes (Signed)
? ? ?Post Partum Visit Note ? ?Sylvia Bullock is a 26 y.o. G2R4270 female who presents for a postpartum visit. She is 10 weeks postpartum following a normal spontaneous vaginal delivery at [facility].  I have fully reviewed the prenatal and intrapartum course. The delivery was at 35. gestational weeks.  Anesthesia: none. Postpartum course has been uneventful. Baby is doing well. Baby is feeding by bottle -   . Bleeding no bleeding. Bowel function is normal. Bladder function is normal. Patient is not sexually active. Contraception method is  undecided . Postpartum depression screening: negative. ? ? ?The pregnancy intention screening data noted above was reviewed. Potential methods of contraception were discussed. The patient elected to proceed with No data recorded. ? ? Edinburgh Postnatal Depression Scale - 10/01/21 0931   ? ?  ? Edinburgh Postnatal Depression Scale:  In the Past 7 Days  ? I have been able to laugh and see the funny side of things. 0   ? I have looked forward with enjoyment to things. 0   ? I have blamed myself unnecessarily when things went wrong. 0   ? I have been anxious or worried for no good reason. 0   ? I have felt scared or panicky for no good reason. 0   ? Things have been getting on top of me. 0   ? I have been so unhappy that I have had difficulty sleeping. 0   ? I have felt sad or miserable. 1   ? I have been so unhappy that I have been crying. 0   ? The thought of harming myself has occurred to me. 0   ? Edinburgh Postnatal Depression Scale Total 1   ? ?  ?  ? ?  ? ? ?Health Maintenance Due  ?Topic Date Due  ? COVID-19 Vaccine (1) Never done  ? HPV VACCINES (1 - 2-dose series) Never done  ? ? ?  ? ?Review of Systems ?Pertinent items noted in HPI and remainder of comprehensive ROS otherwise negative. ? ?Objective:  ?BP 110/71   Pulse (!) 53   Wt 90 lb (40.8 kg)   LMP 10/25/2020   BMI 16.46 kg/m?   ? ?General:  alert, cooperative, and no distress   ? Breasts:  normal  ?Lungs: clear to auscultation bilaterally  ?Heart:  regular rate and rhythm  ?Abdomen: soft, non-tender; bowel sounds normal; no masses,  no organomegaly   ?Wound   ?GU exam:  normal  ?     ?Assessment:  ? ? There are no diagnoses linked to this encounter. ? ?Normal postpartum exam.  ? ?Plan:  ? ?Essential components of care per ACOG recommendations: ? ?1.  Mood and well being: Patient with negative depression screening today. Reviewed local resources for support.  ?- Patient tobacco use? No.   ?- hx of drug use? No.   ? ?2. Infant care and feeding:  ?-Patient currently breastmilk feeding? No.  ?-Social determinants of health (SDOH) reviewed in EPIC. No concerns ? ?3. Sexuality, contraception and birth spacing ?- Patient does not want a pregnancy in the next year.  Desired family size is 2 children.  ?- Reviewed reproductive life planning. Reviewed contraceptive methods based on pt preferences and effectiveness.  Patient desired IUD or IUS today.   ?- Discussed birth spacing of 18 months ?IUD Procedure Note ?Patient identified, informed consent performed, signed copy in chart, time out was performed.  Urine pregnancy test negative. ? ?  Speculum placed in the vagina.  Cervix visualized.  Cleaned with Betadine x 2.  Grasped anteriorly with a single tooth tenaculum.  Uterus sounded to 7 cm.  Liletta IUD placed per manufacturer's recommendations.  Strings trimmed to 3 cm. Tenaculum was removed, good hemostasis noted.  Patient tolerated procedure well.  ? ?Patient given post procedure instructions and Mirena care card with expiration date.  Patient is asked to check IUD strings periodically and follow up in 4-6 weeks for IUD check. ? ? ?4. Sleep and fatigue ?-Encouraged family/partner/community support of 4 hrs of uninterrupted sleep to help with mood and fatigue ? ?5. Physical Recovery  ?- Discussed patients delivery and complications. She describes her labor as good. ?- Patient had a Vaginal, no  problems at delivery. Perineal healing reviewed. Patient expressed understanding ?- Patient has urinary incontinence? No. ?- Patient is safe to resume physical and sexual activity ? ?6.  Health Maintenance ?- HM due items addressed Yes ?- Last pap smear  ?Diagnosis  ?Date Value Ref Range Status  ?09/17/2019   Final  ? - Negative for intraepithelial lesion or malignancy (NILM)  ? Pap smear not done at today's visit.  ?-Breast Cancer screening indicated? No.  ? ?7. Chronic Disease/Pregnancy Condition follow up: None ? ?- PCP follow up as needed ? ?Catalina Antigua, MD ?[facility] [employer], [facility]  ?

## 2021-11-05 ENCOUNTER — Ambulatory Visit (INDEPENDENT_AMBULATORY_CARE_PROVIDER_SITE_OTHER): Payer: Medicaid Other | Admitting: Family Medicine

## 2021-11-05 ENCOUNTER — Encounter: Payer: Self-pay | Admitting: Family Medicine

## 2021-11-05 VITALS — Wt 88.0 lb

## 2021-11-05 DIAGNOSIS — Z30431 Encounter for routine checking of intrauterine contraceptive device: Secondary | ICD-10-CM

## 2021-11-05 NOTE — Progress Notes (Signed)
? ?  Subjective:  ? ? Patient ID: Sylvia Bullock is a 26 y.o. female presenting with No chief complaint on file. ? on 11/05/2021 ? ?HPI: ?Here for IUD string check. Having spotting. ? ?Review of Systems  ?Constitutional:  Negative for chills and fever.  ?Respiratory:  Negative for shortness of breath.   ?Cardiovascular:  Negative for chest pain.  ?Gastrointestinal:  Negative for abdominal pain, nausea and vomiting.  ?Genitourinary:  Negative for dysuria.  ?Skin:  Negative for rash.  ?   ?Objective:  ?  ?Wt 88 lb (39.9 kg)   Breastfeeding Unknown   BMI 16.10 kg/m?  ?Physical Exam ?Exam conducted with a chaperone present.  ?Constitutional:   ?   General: She is not in acute distress. ?   Appearance: She is well-developed.  ?HENT:  ?   Head: Normocephalic and atraumatic.  ?Eyes:  ?   General: No scleral icterus. ?Cardiovascular:  ?   Rate and Rhythm: Normal rate.  ?Pulmonary:  ?   Effort: Pulmonary effort is normal.  ?Abdominal:  ?   Palpations: Abdomen is soft.  ?Genitourinary: ?   Cervix: Normal.  ?   Comments: IUD strings seen ?Musculoskeletal:  ?   Cervical back: Neck supple.  ?Skin: ?   General: Skin is warm and dry.  ?Neurological:  ?   Mental Status: She is alert and oriented to person, place, and time.  ? ? ? ?   ?Assessment & Plan:  ?IUD check up - ususal expectations. ? ? ?Return if symptoms worsen or fail to improve. ? ?Reva Bores, MD ?11/05/2021 ?3:40 PM ? ? ?

## 2021-11-05 NOTE — Progress Notes (Signed)
Interpereter Ipad used. ID [id number].Patient presents for string check. Patient having some bleeding/ spotting.  Armandina Stammer RN ?
